# Patient Record
Sex: Female | Born: 1980
Health system: Southern US, Community
[De-identification: ages and names within clinical notes are randomized; demographics above are authoritative.]

## PROBLEM LIST (undated history)

## (undated) ENCOUNTER — Inpatient Hospital Stay (HOSPITAL_COMMUNITY): Payer: Self-pay

## (undated) ENCOUNTER — Inpatient Hospital Stay (HOSPITAL_COMMUNITY): Payer: 59

## (undated) ENCOUNTER — Emergency Department (HOSPITAL_COMMUNITY): Admission: EM | Payer: Self-pay | Source: Home / Self Care

## (undated) DIAGNOSIS — I10 Essential (primary) hypertension: Secondary | ICD-10-CM

## (undated) DIAGNOSIS — E039 Hypothyroidism, unspecified: Secondary | ICD-10-CM

## (undated) DIAGNOSIS — K219 Gastro-esophageal reflux disease without esophagitis: Secondary | ICD-10-CM

## (undated) DIAGNOSIS — K429 Umbilical hernia without obstruction or gangrene: Secondary | ICD-10-CM

## (undated) DIAGNOSIS — Z3483 Encounter for supervision of other normal pregnancy, third trimester: Secondary | ICD-10-CM

## (undated) DIAGNOSIS — Z9851 Tubal ligation status: Secondary | ICD-10-CM

## (undated) HISTORY — PX: NO PAST SURGERIES: SHX2092

## (undated) HISTORY — PX: WISDOM TOOTH EXTRACTION: SHX21

---
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
## 2000-12-05 ENCOUNTER — Emergency Department (HOSPITAL_COMMUNITY): Admission: EM | Admit: 2000-12-05 | Discharge: 2000-12-05 | Payer: Self-pay | Admitting: Emergency Medicine

## 2013-07-11 ENCOUNTER — Emergency Department (HOSPITAL_COMMUNITY): Payer: 59

## 2013-07-11 ENCOUNTER — Inpatient Hospital Stay (HOSPITAL_COMMUNITY)
Admission: EM | Admit: 2013-07-11 | Discharge: 2013-07-11 | Disposition: A | Discharge status: Other Acute Inpatient Hospital | Payer: 59 | Attending: Emergency Medicine | Admitting: Emergency Medicine

## 2013-07-11 ENCOUNTER — Encounter (HOSPITAL_COMMUNITY): Payer: Self-pay | Admitting: *Deleted

## 2013-07-11 DIAGNOSIS — O209 Hemorrhage in early pregnancy, unspecified: Secondary | ICD-10-CM

## 2013-07-11 DIAGNOSIS — E079 Disorder of thyroid, unspecified: Secondary | ICD-10-CM | POA: Insufficient documentation

## 2013-07-11 DIAGNOSIS — E039 Hypothyroidism, unspecified: Secondary | ICD-10-CM | POA: Insufficient documentation

## 2013-07-11 DIAGNOSIS — O039 Complete or unspecified spontaneous abortion without complication: Secondary | ICD-10-CM | POA: Insufficient documentation

## 2013-07-11 DIAGNOSIS — O034 Incomplete spontaneous abortion without complication: Secondary | ICD-10-CM

## 2013-07-11 HISTORY — DX: Hypothyroidism, unspecified: E03.9

## 2013-07-11 LAB — CBC
HCT: 37.3 % (ref 36.0–46.0)
Hemoglobin: 13 g/dL (ref 12.0–15.0)
MCH: 29.7 pg (ref 26.0–34.0)
MCHC: 34.9 g/dL (ref 30.0–36.0)
MCV: 85.2 fL (ref 78.0–100.0)
Platelets: 278 10*3/uL (ref 150–400)
RBC: 4.38 MIL/uL (ref 3.87–5.11)
RDW: 12.9 % (ref 11.5–15.5)
WBC: 8.6 10*3/uL (ref 4.0–10.5)

## 2013-07-11 LAB — ABO/RH: ABO/RH(D): A POS

## 2013-07-11 LAB — HCG, QUANTITATIVE, PREGNANCY: hCG, Beta Chain, Quant, S: 5632 m[IU]/mL — ABNORMAL HIGH (ref <5)

## 2013-07-11 MED ORDER — SODIUM CHLORIDE 0.9 % IV SOLN
INTRAVENOUS | Status: DC
  Administered 2013-07-11: 10:00:00 via INTRAVENOUS

## 2013-07-11 MED ORDER — OXYCODONE-ACETAMINOPHEN 5-325 MG PO TABS
2.0000 | ORAL_TABLET | Freq: Once | ORAL | Status: AC
  Administered 2013-07-11: 2 via ORAL
  Filled 2013-07-11: qty 2

## 2013-07-11 MED ORDER — HYDROMORPHONE HCL PF 1 MG/ML IJ SOLN
1.0000 mg | Freq: Once | INTRAMUSCULAR | Status: AC
  Administered 2013-07-11: 1 mg via INTRAVENOUS
  Filled 2013-07-11: qty 1

## 2013-07-11 MED ORDER — OXYCODONE-ACETAMINOPHEN 5-325 MG PO TABS: 1.0000 | ORAL_TABLET | ORAL | Status: DC | PRN

## 2013-07-11 MED ORDER — PROMETHAZINE HCL 25 MG PO TABS: 25.0000 mg | ORAL_TABLET | Freq: Four times a day (QID) | ORAL | Status: DC | PRN

## 2013-07-11 NOTE — ED Notes (Signed)
Pt reports she is 8 weeks 4 days bleeding with cramping that increases in intensity approx every 2 mins. sts blood had tissue in it as well. lmp July 12. Hx of blight ovum last year. No living children.

## 2013-07-11 NOTE — ED Provider Notes (Signed)
Patient complains of vaginal bleeding cramping and passing tissue since 4:30 AM today. She has used 3 pads since onset. Presently pain free. I spoke with Dr.Henley. [facility] Maternity admissions unit  for [facility] Results for orders placed during the hospital encounter of 07/11/13  CBC      Result Value Range   WBC 8.6  4.0 - 10.5 K/uL   RBC 4.38  3.87 - 5.11 MIL/uL   Hemoglobin 13.0  12.0 - 15.0 g/dL   HCT 16.1  09.6 - 04.5 %   MCV 85.2  78.0 - 100.0 fL   MCH 29.7  26.0 - 34.0 pg   MCHC 34.9  30.0 - 36.0 g/dL   RDW 40.9  81.1 - 91.4 %   Platelets 278  150 - 400 K/uL  HCG, QUANTITATIVE, PREGNANCY      Result Value Range   hCG, Beta Chain, Quant, S 5632 (*) <5 mIU/mL  ABO/RH      Result Value Range   ABO/RH(D) A POS     No rh immune globuloin NOT A RH IMMUNE GLOBULIN CANDIDATE, PT RH POSITIVE     US Ob Comp Less 14 Wks  07/11/2013   CLINICAL DATA:  Early pregnancy. Abnormal uterine bleeding.  EXAM: OBSTETRIC <14 WK [place] AND TRANSVAGINAL OB US  TECHNIQUE: Both transabdominal and transvaginal ultrasound examinations were performed for complete evaluation of the gestation as well as the maternal uterus, adnexal regions, and pelvic cul-de-sac. Transvaginal technique was performed to assess early pregnancy.  COMPARISON:  None.  FINDINGS: Intrauterine gestational sac: Single. The gestational sac is located low in the lower uterine segment near the cervix.  Yolk sac:  Yes  Embryo:  Yes  Cardiac Activity: No  CRL:   3.5  mm   6 w 0 d                  [place] EDC: 03/06/2014  Maternal uterus/adnexae: There is debris and clot in the uterine cavity extending from the fundus into the cervix and vagina. The cervix is open and filled with debris, measuring 6.5 mm in diameter. The right ovary is normal. Left ovary is not visualized. No free fluid.  IMPRESSION: Spontaneous abortion in progress. No fetal heart activity.   Electronically Signed   By: Geanie Cooley   On: 07/11/2013 08:09    US Ob Transvaginal  07/11/2013   CLINICAL DATA:  Early pregnancy. Abnormal uterine bleeding.  EXAM: OBSTETRIC <14 WK [place] AND TRANSVAGINAL OB US  TECHNIQUE: Both transabdominal and transvaginal ultrasound examinations were performed for complete evaluation of the gestation as well as the maternal uterus, adnexal regions, and pelvic cul-de-sac. Transvaginal technique was performed to assess early pregnancy.  COMPARISON:  None.  FINDINGS: Intrauterine gestational sac: Single. The gestational sac is located low in the lower uterine segment near the cervix.  Yolk sac:  Yes  Embryo:  Yes  Cardiac Activity: No  CRL:   3.5  mm   6 w 0 d                  [place] EDC: 03/06/2014  Maternal uterus/adnexae: There is debris and clot in the uterine cavity extending from the fundus into the cervix and vagina. The cervix is open and filled with debris, measuring 6.5 mm in diameter. The right ovary is normal. Left ovary is not visualized. No free fluid.  IMPRESSION: Spontaneous abortion in progress. No fetal heart activity.   Electronically Signed  By: Geanie Cooley   On: 07/11/2013 08:09   Dx inevitable abortion  Doug Sou, MD 07/11/13 [phone]

## 2013-07-11 NOTE — Progress Notes (Signed)
07/11/13 1300  Clinical Encounter Type  Visited With Patient and family together (husband Jill Murillo, parents)  Visit Type Other (Comment);Spiritual support;Social support (miscarriage--bereavement support)  Referral From Nurse  Spiritual Encounters  Spiritual Needs Grief support;Emotional  Stress Factors  Patient Stress Factors Loss;Loss of control;Major life changes (heartbreaking second miscarriage)  Family Stress Factors Loss;Loss of control;Major life changes (family wants to support Jill Murillo through grief into healing)   Thank you for timely referral.  Provided pastoral presence, reflective listening, bereavement support, and grief education to pt and family. Reviewed Comfort resources with pt and family. Spoke with family together, as well as with Jill Murillo's parents afterward, who are grieving for themselves and also want to support Jill Murillo as much as possible through her grieving and healing process.  Jill Murillo and her mom were most talkative about their hopes, pain, and self-care plans, and they both voiced appreciation for Spiritual Care and Comfort support.  Pt and family are aware of how to reach out if further support desired.  [address] Jill Murillo, [place] [phone]

## 2013-07-11 NOTE — MAU Note (Signed)
Pt brought by ambulance from [facility]. Pt has had heavy vaginal bleeding due to miscarriage. Pt is A&o x3. NAD noted at this time. States bleeding has subsided.

## 2013-07-11 NOTE — ED Notes (Signed)
Consulting civil engineer checked all EMTALA forms/content.   EMTALA and med records given to [facility] for transport.

## 2013-07-11 NOTE — ED Notes (Signed)
Per Dr. Ethelda Chick, wait on the IN/OUT for now.  Will advise MAU.

## 2013-07-11 NOTE — MAU Provider Note (Signed)
History     CSN: [id number]  Arrival date and time: 07/11/13 0416   First Provider Initiated Contact with Patient 07/11/13 1144      Chief Complaint  Patient presents with  . Vaginal Bleeding    [redacted] weeks pregnant   HPI  Ms. Jill Murillo is a 32 y.o. female G2P0010 at Unknown gestation who presents with vaginal bleeding. She was seen over at [facility] today and an US showed that her cervix is open and an SAB is in progress. She was sent here for further evaluation and to discuss her options. She received cytotec times 2 doses with her last miscarriage and ended up with a D & C due to retained products. She does not want to go through that again, and declines cytotec as an option. Currently the patient voices a significant amount of abdominal cramping; 5/10, she received 2 percocet this morning around 0630 which did not help her pain.   OB History   Grav Para Term Preterm Abortions TAB SAB Ect Mult Living                  Past Medical History  Diagnosis Date  . Hypothyroid     History reviewed. No pertinent past surgical history.  History reviewed. No pertinent family history.  History  Substance Use Topics  . Smoking status: Never Smoker   . Smokeless tobacco: Not on file  . Alcohol Use: No    Allergies: No Known Allergies  Prescriptions prior to admission  Medication Sig Dispense Refill  . acetaminophen (TYLENOL) 325 MG tablet Take 650 mg by mouth every 6 (six) hours as needed for pain.      Marland Kitchen levothyroxine (SYNTHROID, LEVOTHROID) 137 MCG tablet Take 137 mcg by mouth daily before breakfast.      . Prenatal Vit-Fe Fumarate-FA (PRENATAL MULTIVITAMIN) TABS tablet Take 1 tablet by mouth daily at 12 noon.       Results for orders placed during the hospital encounter of 07/11/13 (from the past 24 hour(s))  CBC     Status: None   Collection Time    07/11/13  5:15 AM      Result Value Range   WBC 8.6  4.0 - 10.5 K/uL   RBC 4.38  3.87 - 5.11 MIL/uL   Hemoglobin 13.0   12.0 - 15.0 g/dL   HCT 40.9  81.1 - 91.4 %   MCV 85.2  78.0 - 100.0 fL   MCH 29.7  26.0 - 34.0 pg   MCHC 34.9  30.0 - 36.0 g/dL   RDW 78.2  95.6 - 21.3 %   Platelets 278  150 - 400 K/uL  HCG, QUANTITATIVE, PREGNANCY     Status: Abnormal   Collection Time    07/11/13  5:15 AM      Result Value Range   hCG, Beta Chain, Quant, S 5632 (*) <5 mIU/mL  ABO/RH     Status: None   Collection Time    07/11/13  5:20 AM      Result Value Range   ABO/RH(D) A POS     No rh immune globuloin NOT A RH IMMUNE GLOBULIN CANDIDATE, PT RH POSITIVE      US Ob Comp Less 14 Wks  07/11/2013   CLINICAL DATA:  Early pregnancy. Abnormal uterine bleeding.  EXAM: OBSTETRIC <14 WK [place] AND TRANSVAGINAL OB US  TECHNIQUE: Both transabdominal and transvaginal ultrasound examinations were performed for complete evaluation of the gestation as well as the maternal  uterus, adnexal regions, and pelvic cul-de-sac. Transvaginal technique was performed to assess early pregnancy.  COMPARISON:  None.  FINDINGS: Intrauterine gestational sac: Single. The gestational sac is located low in the lower uterine segment near the cervix.  Yolk sac:  Yes  Embryo:  Yes  Cardiac Activity: No  CRL:   3.5  mm   6 w 0 d                  [place] EDC: 03/06/2014  Maternal uterus/adnexae: There is debris and clot in the uterine cavity extending from the fundus into the cervix and vagina. The cervix is open and filled with debris, measuring 6.5 mm in diameter. The right ovary is normal. Left ovary is not visualized. No free fluid.  IMPRESSION: Spontaneous abortion in progress. No fetal heart activity.   Electronically Signed   By: Geanie Cooley   On: 07/11/2013 08:09   US Ob Transvaginal  07/11/2013   CLINICAL DATA:  Early pregnancy. Abnormal uterine bleeding.  EXAM: OBSTETRIC <14 WK [place] AND TRANSVAGINAL OB US  TECHNIQUE: Both transabdominal and transvaginal ultrasound examinations were performed for complete evaluation of the gestation as well as the maternal  uterus, adnexal regions, and pelvic cul-de-sac. Transvaginal technique was performed to assess early pregnancy.  COMPARISON:  None.  FINDINGS: Intrauterine gestational sac: Single. The gestational sac is located low in the lower uterine segment near the cervix.  Yolk sac:  Yes  Embryo:  Yes  Cardiac Activity: No  CRL:   3.5  mm   6 w 0 d                  [place] EDC: 03/06/2014  Maternal uterus/adnexae: There is debris and clot in the uterine cavity extending from the fundus into the cervix and vagina. The cervix is open and filled with debris, measuring 6.5 mm in diameter. The right ovary is normal. Left ovary is not visualized. No free fluid.  IMPRESSION: Spontaneous abortion in progress. No fetal heart activity.   Electronically Signed   By: Geanie Cooley   On: 07/11/2013 08:09    Review of Systems  Constitutional: Negative for fever and chills.  Gastrointestinal: Positive for abdominal pain. Negative for nausea and vomiting.  Genitourinary: Negative for dysuria, urgency, frequency and hematuria.       No vaginal discharge. + vaginal bleeding. No dysuria.    Physical Exam   Blood pressure 124/70, pulse 71, temperature 98.2 F (36.8 C), temperature source Oral, resp. rate 18, height 5\' 3"  (1.6 m), weight 102.513 kg (226 lb), SpO2 99.00%.  Physical Exam  Constitutional: She appears well-developed and well-nourished. No distress.  HENT:  Head: Normocephalic.  Neck: Neck supple.  Respiratory: Effort normal.  GI: Soft. She exhibits no distension. There is no tenderness. There is no rebound and no guarding.  Genitourinary:  Speculum exam: Vagina - Moderate amount of bright red blood in vaginal canal; few small clots.  Cervix - small amount of active bleeding and mucous like discharge from cervical os Bimanual exam: Cervix 1 cm, thick, anterior  Uterus non tender, normal size for gestational age Adnexa non tender, no masses bilaterally Chaperone present for exam.   Skin: She is not  diaphoretic.    MAU Course  Procedures None  MDM 1 mg of dilaudid IVP . Pelvic exam Consulted with Dr. Ambrose Mantle regarding patient exam, plan of care discussed; ok to discharge patient home   Assessment and Plan  A: Vaginal bleeding in pregnancy SAB  in progress   P: Discharge home Bleeding precautions discussed Pt to follow up with Dr. Ebony Hail office in 1 week for follow up Beta Hcg  RX: Percocet 5/325 mg PO Q4-6 hours as needed for pain (#20) no rf        Phenergan 25 mg PO Q6 hours as needed for nausea (#10) no rf Return to MAU if symptoms were to worsen Pelvic rest discussed  Destaney Sarkis IRENE FNP-C 07/11/2013, 2:13 PM

## 2013-07-11 NOTE — ED Provider Notes (Signed)
CSN: [id number]     Arrival date & time 07/11/13  0416 History   First MD Initiated Contact with Patient 07/11/13 [phone]     Chief Complaint  Patient presents with  . Vaginal Bleeding    [redacted] weeks pregnant   (Consider location/radiation/quality/duration/timing/severity/associated sxs/prior Treatment) HPI 32 yo female presents to the ER with complaint of lower abdominal cramping, low back pain, and vaginal bleeding.  Pt reports she is G2P0010 at 8 weeks and 4 days.  She is followed by [facility].  She reports u/s at 6 weeks showed IUP, fetal pole.  She has prior blighted ovum last year, treated with oral meds for termination.  She reports she is passing clots and tissue.  Past Medical History  Diagnosis Date  . Hypothyroid    History reviewed. No pertinent past surgical history. History reviewed. No pertinent family history. History  Substance Use Topics  . Smoking status: Never Smoker   . Smokeless tobacco: Not on file  . Alcohol Use: No   OB History   Grav Para Term Preterm Abortions TAB SAB Ect Mult Living                 Review of Systems  All other systems reviewed and are negative.  other than listed in hpi  Allergies  Review of patient's allergies indicates no known allergies.  Home Medications   Current Outpatient Rx  Name  Route  Sig  Dispense  Refill  . acetaminophen (TYLENOL) 325 MG tablet   Oral   Take 650 mg by mouth every 6 (six) hours as needed for pain.         Marland Kitchen levothyroxine (SYNTHROID, LEVOTHROID) 137 MCG tablet   Oral   Take 137 mcg by mouth daily before breakfast.         . Prenatal Vit-Fe Fumarate-FA (PRENATAL MULTIVITAMIN) TABS tablet   Oral   Take 1 tablet by mouth daily at 12 noon.          BP 146/95  Pulse 80  Temp(Src) 98.3 F (36.8 C)  Resp 20  SpO2 98% Physical Exam  Nursing note and vitals reviewed. Constitutional: She is oriented to person, place, and time. She appears well-developed and well-nourished.  HENT:   Head: Normocephalic and atraumatic.  Nose: Nose normal.  Mouth/Throat: Oropharynx is clear and moist.  Eyes: Conjunctivae and EOM are normal. Pupils are equal, round, and reactive to light.  Neck: Normal range of motion. Neck supple. No JVD present. No tracheal deviation present. No thyromegaly present.  Cardiovascular: Normal rate, regular rhythm, normal heart sounds and intact distal pulses.  Exam reveals no gallop and no friction rub.   No murmur heard. Pulmonary/Chest: Effort normal and breath sounds normal. No stridor. No respiratory distress. She has no wheezes. She has no rales. She exhibits no tenderness.  Abdominal: Soft. Bowel sounds are normal. She exhibits no distension and no mass. There is tenderness (lower abdominal pain). There is no rebound and no guarding.  Genitourinary:  External genitalia normal Vagina with dark blood and clots in vault Cervix open, tissue and clot noted in os, removed with ring forceps No cervical motion tenderness Adnexa palpated, diffuse tenderness noted Bladder palpated no tenderness Uterus palpated diffuse tenderness    Musculoskeletal: Normal range of motion. She exhibits no edema and no tenderness.  Lymphadenopathy:    She has no cervical adenopathy.  Neurological: She is alert and oriented to person, place, and time. She exhibits normal muscle tone. Coordination normal.  Skin: Skin is warm and dry. No rash noted. No erythema. No pallor.  Psychiatric: She has a normal mood and affect. Her behavior is normal. Judgment and thought content normal.    ED Course  Procedures (including critical care time) Labs Review Labs Reviewed  HCG, QUANTITATIVE, PREGNANCY - Abnormal; Notable for the following:    hCG, Beta Chain, Quant, S 5632 (*)    All other components within normal limits  CBC  ABO/RH   Imaging Review No results found.  MDM   1. Vaginal bleeding in pregnancy, first trimester   2. Inevitable complete miscarriage without  complication     32 yo female with what appears to be inevitable miscarriage given open os, tissue in os/vagina.  D/w Dr Senaida Ores, on call for Dr Ellyn Hack, pt's OB.  She recommends getting u/s of uterus to determine amount of tissue remaining to be passed if this is a miscarriage, or if she still has a viable pregnancy.  Pt updated on plan.  Pt agreeable.  Will provide pain medication.  Dr Senaida Ores requested tissue retrieved from cervical os be sent down to path, but it has been disposed of already.      Olivia Mackie, MD 07/11/13 [phone]

## 2013-07-11 NOTE — ED Notes (Signed)
Pt states that she is [redacted] weeks pregnant and she started bleeding this morning around 02:00. Pt states abdominal cramping and back pain. Pt states blood is bright red.

## 2013-10-12 NOTE — L&D Delivery Note (Signed)
Delivery Note Pt progressed to complete and pushed well.  At 2252 had temp to 100.4, started on Unasyn.  Temp up to 101.6 prior to delivery.  At 12:13 AM a viable female was delivered via Vaginal, Spontaneous Delivery (Presentation: Left Occiput Anterior).  APGAR: 6, 8; weight 6 lb 15.8 oz (3170 g).   Placenta status: Intact, Spontaneous.  Cord: 3 vessels with the following complications: None.  Cord pH: 6.96.  Amniotic fluid was foul smelling.  Anesthesia: Epidural  Episiotomy: None Lacerations: 2nd degree Suture Repair: 3.0 vicryl rapide Est. Blood Loss (mL): 300  Mom to postpartum.  Baby to Couplet care / Skin to Skin.  Will discuss circumcision in the morning.  Tosha Belgarde D 06/05/2014, 12:46 AM

## 2013-10-16 ENCOUNTER — Encounter (HOSPITAL_BASED_OUTPATIENT_CLINIC_OR_DEPARTMENT_OTHER): Payer: Self-pay | Admitting: Emergency Medicine

## 2013-10-16 ENCOUNTER — Emergency Department (HOSPITAL_BASED_OUTPATIENT_CLINIC_OR_DEPARTMENT_OTHER)
Admission: EM | Admit: 2013-10-16 | Discharge: 2013-10-16 | Disposition: A | Discharge status: Home / Self Care | Payer: 59 | Attending: Emergency Medicine | Admitting: Emergency Medicine

## 2013-10-16 ENCOUNTER — Emergency Department (HOSPITAL_BASED_OUTPATIENT_CLINIC_OR_DEPARTMENT_OTHER): Payer: 59

## 2013-10-16 DIAGNOSIS — L0390 Cellulitis, unspecified: Secondary | ICD-10-CM

## 2013-10-16 DIAGNOSIS — Z79899 Other long term (current) drug therapy: Secondary | ICD-10-CM | POA: Insufficient documentation

## 2013-10-16 DIAGNOSIS — L03119 Cellulitis of unspecified part of limb: Secondary | ICD-10-CM

## 2013-10-16 DIAGNOSIS — L02619 Cutaneous abscess of unspecified foot: Secondary | ICD-10-CM | POA: Insufficient documentation

## 2013-10-16 DIAGNOSIS — O9989 Other specified diseases and conditions complicating pregnancy, childbirth and the puerperium: Secondary | ICD-10-CM | POA: Insufficient documentation

## 2013-10-16 DIAGNOSIS — E039 Hypothyroidism, unspecified: Secondary | ICD-10-CM | POA: Insufficient documentation

## 2013-10-16 MED ORDER — CEPHALEXIN 500 MG PO CAPS: 500.0000 mg | ORAL_CAPSULE | Freq: Four times a day (QID) | ORAL | Status: DC

## 2013-10-16 MED ORDER — HYDROCODONE-ACETAMINOPHEN 5-325 MG PO TABS: 1.0000 | ORAL_TABLET | ORAL | Status: DC | PRN

## 2013-10-16 NOTE — ED Provider Notes (Signed)
Medical screening examination/treatment/procedure(s) were performed by non-physician practitioner and as supervising physician I was immediately available for consultation/collaboration.  EKG Interpretation   None         Eulia Hatcher E Keisi Eckford, MD 10/16/13 2341 

## 2013-10-16 NOTE — ED Provider Notes (Signed)
CSN: [id number]     Arrival date & time 10/16/13  1853 History   First MD Initiated Contact with Patient 10/16/13 2026     Chief Complaint  Patient presents with  . Foot Pain   (Consider location/radiation/quality/duration/timing/severity/associated sxs/prior Treatment) HPI Comments: Pt states that there was swelling yesterday and then the redness has increased throughout the day today:denies history of gout  Patient is a 33 y.o. female presenting with lower extremity pain. The history is provided by the patient. No language interpreter was used.  Foot Pain This is a new problem. The current episode started yesterday. The problem occurs constantly. The problem has been gradually worsening. The symptoms are aggravated by walking. She has tried nothing for the symptoms.    Past Medical History  Diagnosis Date  . Hypothyroid    History reviewed. No pertinent past surgical history. No family history on file. History  Substance Use Topics  . Smoking status: Never Smoker   . Smokeless tobacco: Not on file  . Alcohol Use: No   OB History   Grav Para Term Preterm Abortions TAB SAB Ect Mult Living   1              Review of Systems  Constitutional: Negative.   Respiratory: Negative.   Cardiovascular: Negative.     Allergies  Review of patient's allergies indicates no known allergies.  Home Medications   Current Outpatient Rx  Name  Route  Sig  Dispense  Refill  . acetaminophen (TYLENOL) 325 MG tablet   Oral   Take 650 mg by mouth every 6 (six) hours as needed for pain.         . cephALEXin (KEFLEX) 500 MG capsule   Oral   Take 1 capsule (500 mg total) by mouth 4 (four) times daily.   28 capsule   0   . HYDROcodone-acetaminophen (NORCO/VICODIN) 5-325 MG per tablet   Oral   Take 1-2 tablets by mouth every 4 (four) hours as needed.   10 tablet   0   . levothyroxine (SYNTHROID, LEVOTHROID) 137 MCG tablet   Oral   Take 137 mcg by mouth daily before breakfast.          . oxyCODONE-acetaminophen (PERCOCET/ROXICET) 5-325 MG per tablet   Oral   Take 1-2 tablets by mouth every 4 (four) hours as needed for pain (Q4-6 hour PRN).   20 tablet   0   . Prenatal Vit-Fe Fumarate-FA (PRENATAL MULTIVITAMIN) TABS tablet   Oral   Take 1 tablet by mouth daily at 12 noon.         . promethazine (PHENERGAN) 25 MG tablet   Oral   Take 1 tablet (25 mg total) by mouth every 6 (six) hours as needed for nausea.   10 tablet   0    BP 144/97  Pulse 84  Temp(Src) 98.2 F (36.8 C) (Oral)  Resp 18  Ht 5\' 4"  (1.626 m)  Wt 223 lb (101.152 kg)  BMI 38.26 kg/m2  SpO2 100%  LMP 09/02/2013 Physical Exam  Nursing note and vitals reviewed. Constitutional: She is oriented to person, place, and time. She appears well-developed and well-nourished.  Cardiovascular: Normal rate and regular rhythm.   Pulmonary/Chest: Breath sounds normal.  Musculoskeletal: Normal range of motion.  Pedal pulses intact  Neurological: She is alert and oriented to person, place, and time. Coordination normal.  Skin:  Redness noted at the base of the left great toe with redness and going more proximal  up the foot    ED Course  Procedures (including critical care time) Labs Review Labs Reviewed - No data to display Imaging Review Dg Foot Complete Left  10/16/2013   CLINICAL DATA:  Left foot pain. Redness over the 1st metatarsal head.  EXAM: LEFT FOOT - COMPLETE 3+ VIEW  COMPARISON:  None.  FINDINGS: Negative for fracture or dislocation. Mild joint space narrowing and degenerative changes at the 1st MTP joint. There is no evidence for erosions or osteolysis. Alignment of the foot is normal.  IMPRESSION: No acute bone abnormality in the left foot.   Electronically Signed   By: Markus Daft M.D.   On: 10/16/2013 21:19    EKG Interpretation   None       MDM   1. Cellulitis    Discussed risk with use of vicodin in pregnancy:discussed possibility of gout with pt    Glendell Docker,  NP 10/16/13 2136

## 2013-10-16 NOTE — ED Notes (Signed)
Pain and swelling to her left foot. Redness at her great toe. No known injury.

## 2013-10-16 NOTE — Discharge Instructions (Signed)
Cellulitis °Cellulitis is an infection of the skin and the tissue under the skin. The infected area is usually red and tender. This happens most often in the arms and lower legs. °HOME CARE  °· Take your antibiotic medicine as told. Finish the medicine even if you start to feel better. °· Keep the infected arm or leg raised (elevated). °· Put a warm cloth on the area up to 4 times per day. °· Only take medicines as told by your doctor. °· Keep all doctor visits as told. °GET HELP RIGHT AWAY IF:  °· You have a fever. °· You feel very sleepy. °· You throw up (vomit) or have watery poop (diarrhea). °· You feel sick and have muscle aches and pains. °· You see red streaks on the skin coming from the infected area. °· Your red area gets bigger or turns a dark color. °· Your bone or joint under the infected area is painful after the skin heals. °· Your infection comes back in the same area or different area. °· You have a puffy (swollen) bump in the infected area. °· You have new symptoms. °MAKE SURE YOU:  °· Understand these instructions. °· Will watch your condition. °· Will get help right away if you are not doing well or get worse. °Document Released: 03/16/2008 Document Revised: 03/29/2012 Document Reviewed: 12/14/2011 °[employer]® Patient Information ©2014 [employer]. ° °

## 2013-11-02 LAB — OB RESULTS CONSOLE GC/CHLAMYDIA
CHLAMYDIA, DNA PROBE: NEGATIVE
GC PROBE AMP, GENITAL: NEGATIVE

## 2013-11-02 LAB — OB RESULTS CONSOLE HEPATITIS B SURFACE ANTIGEN: Hepatitis B Surface Ag: NEGATIVE

## 2013-11-02 LAB — OB RESULTS CONSOLE HIV ANTIBODY (ROUTINE TESTING): HIV: NONREACTIVE

## 2013-11-02 LAB — OB RESULTS CONSOLE RUBELLA ANTIBODY, IGM: RUBELLA: IMMUNE

## 2013-11-02 LAB — OB RESULTS CONSOLE RPR: RPR: NONREACTIVE

## 2013-12-08 ENCOUNTER — Encounter (HOSPITAL_COMMUNITY): Payer: Self-pay

## 2013-12-08 ENCOUNTER — Inpatient Hospital Stay (HOSPITAL_COMMUNITY)
Admission: AD | Admit: 2013-12-08 | Discharge: 2013-12-08 | Disposition: A | Discharge status: Home / Self Care | Payer: 59 | Source: Ambulatory Visit | Attending: Obstetrics and Gynecology | Admitting: Obstetrics and Gynecology

## 2013-12-08 DIAGNOSIS — O209 Hemorrhage in early pregnancy, unspecified: Secondary | ICD-10-CM | POA: Insufficient documentation

## 2013-12-08 DIAGNOSIS — Z3490 Encounter for supervision of normal pregnancy, unspecified, unspecified trimester: Secondary | ICD-10-CM

## 2013-12-08 NOTE — MAU Note (Signed)
Spotting started an hour ago.  Bright red when wiped, got lighter.  Didn't have to wear a pad. Denies pain.

## 2013-12-08 NOTE — MAU Provider Note (Signed)
  History     CSN: [id number]  Arrival date and time: 12/08/13 2143   None     Chief Complaint  Patient presents with  . Vaginal Bleeding   HPI Jill Murillo is 33 y.o. G3P0020 [redacted]w[redacted]d weeks presenting with vaginal spotting 1 hr ago--bright red at first and now lighter.  Denies pain.  Hx of 2 miscarriages--concerned.  Patient of Dr. Roe Rutherford.    Past Medical History  Diagnosis Date  . Hypothyroid     Past Surgical History  Procedure Laterality Date  . No past surgeries    . Wisdom tooth extraction      Family History  Problem Relation Age of Onset  . Hypertension Father     History  Substance Use Topics  . Smoking status: Never Smoker   . Smokeless tobacco: Never Used  . Alcohol Use: No    Allergies: No Known Allergies  Prescriptions prior to admission  Medication Sig Dispense Refill  . acetaminophen (TYLENOL) 325 MG tablet Take 650 mg by mouth every 6 (six) hours as needed for pain.      Marland Kitchen levothyroxine (SYNTHROID, LEVOTHROID) 137 MCG tablet Take 137 mcg by mouth daily before breakfast.      . Prenatal Vit-Fe Fumarate-FA (PRENATAL MULTIVITAMIN) TABS tablet Take 1 tablet by mouth daily at 12 noon.        Review of Systems  Gastrointestinal: Negative for abdominal pain.  Genitourinary:       + for vaginal bleeding, no clots   Physical Exam   Blood pressure 125/72, pulse 69, temperature 98.1 F (36.7 C), temperature source Oral, resp. rate 20, last menstrual period 09/02/2013.  Physical Exam  Constitutional: She is oriented to person, place, and time. She appears well-developed and well-nourished. No distress.  HENT:  Head: Normocephalic.  Neck: Normal range of motion.  Cardiovascular: Normal rate.   Respiratory: Effort normal.  GI: Soft. She exhibits no distension and no mass. There is no tenderness. There is no rebound and no guarding.  Genitourinary: There is no rash, tenderness or lesion on the right labia. There is no rash, tenderness or lesion on  the left labia. Uterus is enlarged (13 -14 week size ). Uterus is not tender. Bleeding: scant blood on swab.  Neg for active bleeding and clot. No vaginal discharge found.  Cervix is closed  Neurological: She is alert and oriented to person, place, and time.  Skin: Skin is warm and dry.  Psychiatric: She has a normal mood and affect. Her behavior is normal.   FHR by doppler 140  MAU Course  Procedures  MDM Reported MSE to Dr. Marvel Plan at 10:45.  Reported physical findings and + FHTs by doppler Order for patient to have pelvic rest and follow up with Dr. Melba Coon  Assessment and Plan  A:  Vaginal bleeding [redacted]w[redacted]d gestation      Viable pregnancy  P:  Reassured       Pelvic rest until re-evaluated        Follow up next week in the office      Call if sxs worsen         Damontay Alred,EVE M 12/08/2013, 10:23 PM

## 2013-12-08 NOTE — Discharge Instructions (Signed)
Pelvic Rest °Pelvic rest is sometimes recommended for women when:  °· The placenta is partially or completely covering the opening of the cervix (placenta previa). °· There is bleeding between the uterine wall and the amniotic sac in the first trimester (subchorionic hemorrhage). °· The cervix begins to open without labor starting (incompetent cervix, cervical insufficiency). °· The labor is too early (preterm labor). °HOME CARE INSTRUCTIONS °· Do not have sexual intercourse, stimulation, or an orgasm. °· Do not use tampons, douche, or put anything in the vagina. °· Do not lift anything over 10 pounds (4.5 kg). °· Avoid strenuous activity or straining your pelvic muscles. °SEEK MEDICAL CARE IF:  °· You have any vaginal bleeding during pregnancy. Treat this as a potential emergency. °· You have cramping pain felt low in the stomach (stronger than menstrual cramps). °· You notice vaginal discharge (watery, mucus, or bloody). °· You have a low, dull backache. °· There are regular contractions or uterine tightening. °SEEK IMMEDIATE MEDICAL CARE IF: °You have vaginal bleeding and have placenta previa.  °Document Released: 01/23/2011 Document Revised: 12/21/2011 Document Reviewed: 01/23/2011 °Exit[employer]® Patient Information ©2014 [employer]. ° °Vaginal Bleeding During Pregnancy, Second Trimester °A small amount of bleeding (spotting) from the vagina is relatively common in pregnancy. It usually stops on its own. Various things can cause bleeding or spotting in pregnancy. Some bleeding may be related to the pregnancy, and some may not. Sometimes the bleeding is normal and is not a problem. However, bleeding can also be a sign of something serious. Be sure to tell your health care provider about any vaginal bleeding right away. °Some possible causes of vaginal bleeding during the second trimester include: °· Infection, inflammation, or growths on the cervix.   °· The placenta may be partially or completely covering the  opening of the cervix inside the uterus (placenta previa). °· The placenta may have separated from the uterus (abruption of the placenta).   °· You may be having early (preterm) labor.   °· The cervix may not be strong enough to keep a baby inside the uterus (cervical insufficiency).   °· Tiny cysts may have developed in the uterus instead of pregnancy tissue (molar pregnancy).  °HOME CARE INSTRUCTIONS  °Watch your condition for any changes. The following actions may help to lessen any discomfort you are feeling: °· Follow your health care provider's instructions for limiting your activity. If your health care provider orders bed rest, you may need to stay in bed and only get up to use the bathroom. However, your health care provider may allow you to continue light activity. °· If needed, make plans for someone to help with your regular activities and responsibilities while you are on bed rest. °· Keep track of the number of pads you use each day, how often you change pads, and how soaked (saturated) they are. Write this down. °· Do not use tampons. Do not douche. °· Do not have sexual intercourse or orgasms until approved by your health care provider. °· If you pass any tissue from your vagina, save the tissue so you can show it to your health care provider. °· Only take over-the-counter or prescription medicines as directed by your health care provider. °· Do not take aspirin because it can make you bleed. °· Do not exercise or perform any strenuous activities or heavy lifting without your health care provider's permission. °· Keep all follow-up appointments as directed by your health care provider. °SEEK MEDICAL CARE IF: °· You have any vaginal bleeding during any part   your pregnancy.  You have cramps or labor pains. SEEK IMMEDIATE MEDICAL CARE IF:   You have severe cramps in your back or belly (abdomen).  You have contractions.  You have a fever, not controlled by medicine.  You have chills.  You  pass large clots or tissue from your vagina.  Your bleeding increases.  You feel lightheaded or weak, or you have fainting episodes.  You are leaking fluid or have a gush of fluid from your vagina. MAKE SURE YOU:  Understand these instructions.  Will watch your condition.  Will get help right away if you are not doing well or get worse. Document Released: 07/08/2005 Document Revised: 07/19/2013 Document Reviewed: 06/05/2013 [facility] Patient Information 2014 [place].

## 2014-05-18 LAB — OB RESULTS CONSOLE GBS: STREP GROUP B AG: NEGATIVE

## 2014-06-04 ENCOUNTER — Inpatient Hospital Stay (HOSPITAL_COMMUNITY): Payer: 59 | Admitting: Anesthesiology

## 2014-06-04 ENCOUNTER — Encounter (HOSPITAL_COMMUNITY): Payer: Self-pay | Admitting: *Deleted

## 2014-06-04 ENCOUNTER — Encounter (HOSPITAL_COMMUNITY): Payer: 59 | Admitting: Anesthesiology

## 2014-06-04 ENCOUNTER — Inpatient Hospital Stay (HOSPITAL_COMMUNITY)
Admission: AD | Admit: 2014-06-04 | Discharge: 2014-06-07 | DRG: 775 | Disposition: A | Discharge status: Home / Self Care | Payer: 59 | Source: Ambulatory Visit | Attending: Obstetrics and Gynecology | Admitting: Obstetrics and Gynecology

## 2014-06-04 DIAGNOSIS — O411090 Infection of amniotic sac and membranes, unspecified, unspecified trimester, not applicable or unspecified: Secondary | ICD-10-CM | POA: Diagnosis present

## 2014-06-04 DIAGNOSIS — Z8249 Family history of ischemic heart disease and other diseases of the circulatory system: Secondary | ICD-10-CM

## 2014-06-04 DIAGNOSIS — O479 False labor, unspecified: Secondary | ICD-10-CM | POA: Diagnosis present

## 2014-06-04 DIAGNOSIS — E079 Disorder of thyroid, unspecified: Secondary | ICD-10-CM | POA: Diagnosis present

## 2014-06-04 DIAGNOSIS — E039 Hypothyroidism, unspecified: Secondary | ICD-10-CM | POA: Diagnosis present

## 2014-06-04 DIAGNOSIS — O99284 Endocrine, nutritional and metabolic diseases complicating childbirth: Secondary | ICD-10-CM

## 2014-06-04 DIAGNOSIS — Z3490 Encounter for supervision of normal pregnancy, unspecified, unspecified trimester: Secondary | ICD-10-CM

## 2014-06-04 DIAGNOSIS — O411290 Chorioamnionitis, unspecified trimester, not applicable or unspecified: Secondary | ICD-10-CM | POA: Diagnosis not present

## 2014-06-04 LAB — COMPREHENSIVE METABOLIC PANEL
ALT: 10 U/L (ref 0–35)
AST: 13 U/L (ref 0–37)
Albumin: 2.9 g/dL — ABNORMAL LOW (ref 3.5–5.2)
Alkaline Phosphatase: 129 U/L — ABNORMAL HIGH (ref 39–117)
Anion gap: 13 (ref 5–15)
BUN: 9 mg/dL (ref 6–23)
CALCIUM: 8.9 mg/dL (ref 8.4–10.5)
CO2: 20 meq/L (ref 19–32)
CREATININE: 0.57 mg/dL (ref 0.50–1.10)
Chloride: 103 mEq/L (ref 96–112)
GFR calc Af Amer: >90 mL/min (ref >90)
Glucose, Bld: 72 mg/dL (ref 70–99)
Potassium: 4.1 mEq/L (ref 3.7–5.3)
Sodium: 136 mEq/L — ABNORMAL LOW (ref 137–147)
Total Bilirubin: 0.3 mg/dL (ref 0.3–1.2)
Total Protein: 6.2 g/dL (ref 6.0–8.3)

## 2014-06-04 LAB — URINALYSIS, ROUTINE W REFLEX MICROSCOPIC
Bilirubin Urine: NEGATIVE
Glucose, UA: NEGATIVE mg/dL
Ketones, ur: NEGATIVE mg/dL
Nitrite: NEGATIVE
PROTEIN: NEGATIVE mg/dL
Specific Gravity, Urine: 1.015 (ref 1.005–1.030)
UROBILINOGEN UA: 0.2 mg/dL (ref 0.0–1.0)
pH: 6 (ref 5.0–8.0)

## 2014-06-04 LAB — CBC
HEMATOCRIT: 38.6 % (ref 36.0–46.0)
HEMOGLOBIN: 13.3 g/dL (ref 12.0–15.0)
MCH: 29.9 pg (ref 26.0–34.0)
MCHC: 34.5 g/dL (ref 30.0–36.0)
MCV: 86.7 fL (ref 78.0–100.0)
Platelets: 188 10*3/uL (ref 150–400)
RBC: 4.45 MIL/uL (ref 3.87–5.11)
RDW: 14.5 % (ref 11.5–15.5)
WBC: 14.3 10*3/uL — ABNORMAL HIGH (ref 4.0–10.5)

## 2014-06-04 LAB — PROTEIN / CREATININE RATIO, URINE
CREATININE, URINE: 103.53 mg/dL
Protein Creatinine Ratio: 0.09 (ref 0.00–0.15)
Total Protein, Urine: 9.5 mg/dL

## 2014-06-04 LAB — TYPE AND SCREEN
ABO/RH(D): A POS
Antibody Screen: NEGATIVE

## 2014-06-04 LAB — URINE MICROSCOPIC-ADD ON

## 2014-06-04 MED ORDER — OXYCODONE-ACETAMINOPHEN 5-325 MG PO TABS: 1.0000 | ORAL_TABLET | ORAL | Status: DC | PRN

## 2014-06-04 MED ORDER — EPHEDRINE 5 MG/ML INJ
10.0000 mg | INTRAVENOUS | Status: DC | PRN
  Filled 2014-06-04: qty 4
  Filled 2014-06-04: qty 2

## 2014-06-04 MED ORDER — OXYTOCIN 40 UNITS IN LACTATED RINGERS INFUSION - SIMPLE MED
62.5000 mL/h | INTRAVENOUS | Status: DC
  Administered 2014-06-05: 62.5 mL/h via INTRAVENOUS
  Filled 2014-06-04: qty 1000

## 2014-06-04 MED ORDER — OXYTOCIN BOLUS FROM INFUSION
500.0000 mL | INTRAVENOUS | Status: DC
  Administered 2014-06-05: 500 mL via INTRAVENOUS

## 2014-06-04 MED ORDER — EPHEDRINE 5 MG/ML INJ
10.0000 mg | INTRAVENOUS | Status: DC | PRN
  Filled 2014-06-04: qty 2

## 2014-06-04 MED ORDER — LACTATED RINGERS IV SOLN: 500.0000 mL | Freq: Once | INTRAVENOUS | Status: DC

## 2014-06-04 MED ORDER — ONDANSETRON HCL 4 MG/2ML IJ SOLN: 4.0000 mg | Freq: Four times a day (QID) | INTRAMUSCULAR | Status: DC | PRN

## 2014-06-04 MED ORDER — LIDOCAINE HCL (PF) 1 % IJ SOLN
30.0000 mL | INTRAMUSCULAR | Status: DC | PRN
  Filled 2014-06-04: qty 30

## 2014-06-04 MED ORDER — ACETAMINOPHEN 325 MG PO TABS
650.0000 mg | ORAL_TABLET | ORAL | Status: DC | PRN
  Administered 2014-06-04: 650 mg via ORAL
  Filled 2014-06-04: qty 2

## 2014-06-04 MED ORDER — CITRIC ACID-SODIUM CITRATE 334-500 MG/5ML PO SOLN: 30.0000 mL | ORAL | Status: DC | PRN

## 2014-06-04 MED ORDER — IBUPROFEN 600 MG PO TABS
600.0000 mg | ORAL_TABLET | Freq: Four times a day (QID) | ORAL | Status: DC | PRN
  Administered 2014-06-05: 600 mg via ORAL
  Filled 2014-06-04: qty 1

## 2014-06-04 MED ORDER — LACTATED RINGERS IV SOLN
INTRAVENOUS | Status: DC
  Administered 2014-06-04 (×2): via INTRAVENOUS

## 2014-06-04 MED ORDER — PHENYLEPHRINE 40 MCG/ML (10ML) SYRINGE FOR IV PUSH (FOR BLOOD PRESSURE SUPPORT)
80.0000 ug | PREFILLED_SYRINGE | INTRAVENOUS | Status: DC | PRN
  Filled 2014-06-04: qty 2

## 2014-06-04 MED ORDER — LACTATED RINGERS IV SOLN
500.0000 mL | INTRAVENOUS | Status: DC | PRN
  Administered 2014-06-04: 1000 mL via INTRAVENOUS

## 2014-06-04 MED ORDER — SODIUM CHLORIDE 0.9 % IV SOLN
3.0000 g | Freq: Once | INTRAVENOUS | Status: AC
  Administered 2014-06-04: 3 g via INTRAVENOUS
  Filled 2014-06-04: qty 3

## 2014-06-04 MED ORDER — PHENYLEPHRINE 40 MCG/ML (10ML) SYRINGE FOR IV PUSH (FOR BLOOD PRESSURE SUPPORT)
80.0000 ug | PREFILLED_SYRINGE | INTRAVENOUS | Status: DC | PRN
  Filled 2014-06-04: qty 2
  Filled 2014-06-04: qty 10

## 2014-06-04 MED ORDER — BUTORPHANOL TARTRATE 1 MG/ML IJ SOLN: 1.0000 mg | INTRAMUSCULAR | Status: DC | PRN

## 2014-06-04 MED ORDER — FENTANYL 2.5 MCG/ML BUPIVACAINE 1/10 % EPIDURAL INFUSION (WH - ANES)
14.0000 mL/h | INTRAMUSCULAR | Status: DC | PRN
  Filled 2014-06-04: qty 125

## 2014-06-04 MED ORDER — LIDOCAINE HCL (PF) 1 % IJ SOLN
INTRAMUSCULAR | Status: DC | PRN
  Administered 2014-06-04: 10 mL

## 2014-06-04 MED ORDER — FENTANYL 2.5 MCG/ML BUPIVACAINE 1/10 % EPIDURAL INFUSION (WH - ANES)
14.0000 mL/h | INTRAMUSCULAR | Status: DC | PRN
  Administered 2014-06-04: 14 mL/h via EPIDURAL

## 2014-06-04 MED ORDER — DIPHENHYDRAMINE HCL 50 MG/ML IJ SOLN: 12.5000 mg | INTRAMUSCULAR | Status: DC | PRN

## 2014-06-04 NOTE — Anesthesia Procedure Notes (Signed)
Epidural Patient location during procedure: OB  Preanesthetic Checklist Completed: patient identified, site marked, surgical consent, pre-op evaluation, timeout performed, IV checked, risks and benefits discussed and monitors and equipment checked  Epidural Patient position: sitting Prep: site prepped and draped and DuraPrep Patient monitoring: continuous pulse ox and blood pressure Approach: midline Injection technique: LOR air  Needle:  Needle type: Tuohy  Needle gauge: 17 G Needle length: 9 cm and 9 Needle insertion depth: 7 cm Catheter type: closed end flexible Catheter size: 19 Gauge Catheter at skin depth: 14 cm Test dose: negative  Assessment Events: blood not aspirated, injection not painful, no injection resistance, negative IV test and no paresthesia  Additional Notes Dosing of Epidural:  1st dose, through catheter ............................................Marland Kitchen  Xylocaine 40 mg  2nd dose, through catheter, after waiting 3 minutes........Marland KitchenXylocaine 60 mg    ( 1% Xylo charted as a single dose in Epic Meds for ease of charting; actual dosing was fractionated as above, for saftey's sake)  As each dose occurred, patient was free of IV sx; and patient exhibited no evidence of SA injection.  Patient is more comfortable after epidural dosed. Please see RN's note for documentation of vital signs,and FHR which are stable.  Patient reminded not to try to ambulate with numb legs, and that an RN must be present when she attempts to get up.

## 2014-06-04 NOTE — Anesthesia Preprocedure Evaluation (Signed)

## 2014-06-04 NOTE — Progress Notes (Signed)
Feeling ctx VE-4/70/-2, vtx, AROM clear, FSE applied Will get epidural and monitor progress

## 2014-06-04 NOTE — H&P (Signed)
Jill Murillo is a 33 y.o. female, G3 P0020, EGA 39+ weeks with EDC 8-29 presenting for eval of ctx.  In MAU over several hours, cervix changed from 2-3 cm to 4 cm.  BP slightly elevated, nl PIH labs, no proteinuria.  Prenatal care complicated by hypothyroidism, see prenatal records for complete history.  Maternal Medical History:  Reason for admission: Contractions.   Contractions: Frequency: regular.   Perceived severity is moderate.    Fetal activity: Perceived fetal activity is normal.    Prenatal complications: no prenatal complications   OB History   Grav Para Term Preterm Abortions TAB SAB Ect Mult Living   3    2  2    0     Past Medical History  Diagnosis Date  . Hypothyroid    Past Surgical History  Procedure Laterality Date  . No past surgeries    . Wisdom tooth extraction     Family History: family history includes Hypertension in her father. Social History:  reports that she has never smoked. She has never used smokeless tobacco. She reports that she does not drink alcohol or use illicit drugs.   Prenatal Transfer Tool  Maternal Diabetes: No Genetic Screening: Normal Maternal Ultrasounds/Referrals: Normal Fetal Ultrasounds or other Referrals:  None Maternal Substance Abuse:  No Significant Maternal Medications:  Meds include: Syntroid Significant Maternal Lab Results:  Lab values include: Group B Strep negative Other Comments:  None  Review of Systems  Respiratory: Negative.   Cardiovascular: Negative.     Dilation: 4 Effacement (%): 60 Station: -2;-1 Exam by:: Dr. Willis Modena Blood pressure 153/85, pulse 96, temperature 98.3 F (36.8 C), temperature source Oral, resp. rate 20, height 5\' 4"  (1.626 m), weight 112.038 kg (247 lb), last menstrual period 09/02/2013. Maternal Exam:  Uterine Assessment: Contraction strength is moderate.  Contraction frequency is regular.   Abdomen: Patient reports no abdominal tenderness. Estimated fetal weight is 7 1/2  lbs.   Fetal presentation: vertex  Introitus: Normal vulva. Normal vagina.  Amniotic fluid character: not assessed.  Pelvis: adequate for delivery.   Cervix: Cervix evaluated by digital exam.     Fetal Exam Fetal Monitor Review: Mode: ultrasound.   Variability: moderate (6-25 bpm).   Pattern: accelerations present and no decelerations.    Fetal State Assessment: Category I - tracings are normal.     Physical Exam  Vitals reviewed. Constitutional: She appears well-developed and well-nourished.  Cardiovascular: Normal rate, regular rhythm and normal heart sounds.   No murmur heard. Respiratory: Effort normal and breath sounds normal. No respiratory distress. She has no wheezes.  GI: Soft.    Prenatal labs: ABO, Rh: --/--/A POS (09/30 0520) Antibody:  neg Rubella:  Immune RPR:   NR HBsAg:   Neg HIV:   NR GBS: Negative (08/07 0000)  GCT:  Nl x 2  Assessment/Plan: IUP at 39+ weeks in early labor with slightly elevated BP, normal labs.  Will admit, AROM and monitor progress and BP.   Bolivar Koranda D 06/04/2014, 5:55 PM

## 2014-06-04 NOTE — Progress Notes (Signed)
Comfortable with epidural Afeb, VSS FHT- Cat II, min-mod variability, variable decels, ctx not tracing well VE-9/C/+1, vtx, IUPC placed Monitor progress and FHT, anticipate SVD, may need amnioinfusion

## 2014-06-04 NOTE — MAU Note (Signed)
UC's this AM, seem to be getting stronger. No leaking or bleeding.

## 2014-06-05 ENCOUNTER — Encounter (HOSPITAL_COMMUNITY): Payer: Self-pay | Admitting: *Deleted

## 2014-06-05 DIAGNOSIS — O411290 Chorioamnionitis, unspecified trimester, not applicable or unspecified: Secondary | ICD-10-CM | POA: Diagnosis not present

## 2014-06-05 LAB — CBC
HCT: 35.4 % — ABNORMAL LOW (ref 36.0–46.0)
HEMATOCRIT: 35.8 % — AB (ref 36.0–46.0)
Hemoglobin: 12.2 g/dL (ref 12.0–15.0)
Hemoglobin: 12.1 g/dL (ref 12.0–15.0)
MCH: 29.3 pg (ref 26.0–34.0)
MCH: 29.7 pg (ref 26.0–34.0)
MCHC: 34.1 g/dL (ref 30.0–36.0)
MCHC: 34.2 g/dL (ref 30.0–36.0)
MCV: 86.1 fL (ref 78.0–100.0)
MCV: 86.8 fL (ref 78.0–100.0)
PLATELETS: 196 10*3/uL (ref 150–400)
Platelets: 171 10*3/uL (ref 150–400)
RBC: 4.16 MIL/uL (ref 3.87–5.11)
RBC: 4.08 MIL/uL (ref 3.87–5.11)
RDW: 14.3 % (ref 11.5–15.5)
RDW: 14.4 % (ref 11.5–15.5)
WBC: 18.5 10*3/uL — ABNORMAL HIGH (ref 4.0–10.5)
WBC: 23.4 10*3/uL — ABNORMAL HIGH (ref 4.0–10.5)

## 2014-06-05 LAB — ABO/RH: ABO/RH(D): A POS

## 2014-06-05 LAB — RPR

## 2014-06-05 MED ORDER — METHYLERGONOVINE MALEATE 0.2 MG PO TABS: 0.2000 mg | ORAL_TABLET | ORAL | Status: DC | PRN

## 2014-06-05 MED ORDER — SODIUM CHLORIDE 0.9 % IV SOLN
Freq: Four times a day (QID) | INTRAVENOUS | Status: DC | PRN
  Administered 2014-06-05: 05:00:00 via INTRAVENOUS

## 2014-06-05 MED ORDER — WITCH HAZEL-GLYCERIN EX PADS: 1.0000 "application " | MEDICATED_PAD | CUTANEOUS | Status: DC | PRN

## 2014-06-05 MED ORDER — SODIUM CHLORIDE 0.9 % IJ SOLN: 3.0000 mL | INTRAMUSCULAR | Status: DC | PRN

## 2014-06-05 MED ORDER — SODIUM CHLORIDE 0.9 % IV SOLN
3.0000 g | Freq: Four times a day (QID) | INTRAVENOUS | Status: DC
  Administered 2014-06-05 – 2014-06-06 (×5): 3 g via INTRAVENOUS
  Filled 2014-06-05 (×7): qty 3

## 2014-06-05 MED ORDER — LEVOTHYROXINE SODIUM 137 MCG PO TABS
137.0000 ug | ORAL_TABLET | Freq: Every day | ORAL | Status: DC
  Administered 2014-06-05 – 2014-06-07 (×3): 137 ug via ORAL
  Filled 2014-06-05 (×4): qty 1

## 2014-06-05 MED ORDER — BENZOCAINE-MENTHOL 20-0.5 % EX AERO
1.0000 "application " | INHALATION_SPRAY | CUTANEOUS | Status: DC | PRN
  Administered 2014-06-05: 1 via TOPICAL
  Filled 2014-06-05: qty 56

## 2014-06-05 MED ORDER — TETANUS-DIPHTH-ACELL PERTUSSIS 5-2.5-18.5 LF-MCG/0.5 IM SUSP: 0.5000 mL | Freq: Once | INTRAMUSCULAR | Status: DC

## 2014-06-05 MED ORDER — SENNOSIDES-DOCUSATE SODIUM 8.6-50 MG PO TABS
2.0000 | ORAL_TABLET | ORAL | Status: DC
  Administered 2014-06-06 (×2): 2 via ORAL
  Filled 2014-06-05 (×2): qty 2

## 2014-06-05 MED ORDER — METHYLERGONOVINE MALEATE 0.2 MG/ML IJ SOLN: 0.2000 mg | INTRAMUSCULAR | Status: DC | PRN

## 2014-06-05 MED ORDER — MEASLES, MUMPS & RUBELLA VAC ~~LOC~~ INJ
0.5000 mL | INJECTION | Freq: Once | SUBCUTANEOUS | Status: DC
  Filled 2014-06-05: qty 0.5

## 2014-06-05 MED ORDER — ONDANSETRON HCL 4 MG/2ML IJ SOLN: 4.0000 mg | INTRAMUSCULAR | Status: DC | PRN

## 2014-06-05 MED ORDER — ONDANSETRON HCL 4 MG PO TABS: 4.0000 mg | ORAL_TABLET | ORAL | Status: DC | PRN

## 2014-06-05 MED ORDER — PRENATAL MULTIVITAMIN CH
1.0000 | ORAL_TABLET | Freq: Every day | ORAL | Status: DC
  Administered 2014-06-05 – 2014-06-07 (×3): 1 via ORAL
  Filled 2014-06-05 (×3): qty 1

## 2014-06-05 MED ORDER — ZOLPIDEM TARTRATE 5 MG PO TABS: 5.0000 mg | ORAL_TABLET | Freq: Every evening | ORAL | Status: DC | PRN

## 2014-06-05 MED ORDER — SIMETHICONE 80 MG PO CHEW: 80.0000 mg | CHEWABLE_TABLET | ORAL | Status: DC | PRN

## 2014-06-05 MED ORDER — LANOLIN HYDROUS EX OINT: TOPICAL_OINTMENT | CUTANEOUS | Status: DC | PRN

## 2014-06-05 MED ORDER — MAGNESIUM HYDROXIDE 400 MG/5ML PO SUSP: 30.0000 mL | ORAL | Status: DC | PRN

## 2014-06-05 MED ORDER — OXYCODONE-ACETAMINOPHEN 5-325 MG PO TABS
1.0000 | ORAL_TABLET | ORAL | Status: DC | PRN
  Administered 2014-06-05 – 2014-06-07 (×4): 1 via ORAL
  Filled 2014-06-05 (×4): qty 1

## 2014-06-05 MED ORDER — DIBUCAINE 1 % RE OINT: 1.0000 "application " | TOPICAL_OINTMENT | RECTAL | Status: DC | PRN

## 2014-06-05 MED ORDER — DIPHENHYDRAMINE HCL 25 MG PO CAPS: 25.0000 mg | ORAL_CAPSULE | Freq: Four times a day (QID) | ORAL | Status: DC | PRN

## 2014-06-05 MED ORDER — IBUPROFEN 600 MG PO TABS
600.0000 mg | ORAL_TABLET | Freq: Four times a day (QID) | ORAL | Status: DC
  Administered 2014-06-05 – 2014-06-07 (×9): 600 mg via ORAL
  Filled 2014-06-05 (×9): qty 1

## 2014-06-05 NOTE — Progress Notes (Signed)
Additional RN called in for delivery

## 2014-06-05 NOTE — Progress Notes (Signed)
PPD #0 Doing well Afeb since delivery, VSS, BP normal Fundus firm Continue routine care, will continue Unasyn today for chorio

## 2014-06-05 NOTE — Anesthesia Postprocedure Evaluation (Signed)
  Anesthesia Post-op Note  Anesthesia Post Note  Patient: Jill Murillo  Procedure(s) Performed: * No procedures listed *  Anesthesia type: Epidural  Patient location: Mother/Baby  Post pain: Pain level controlled  Post assessment: Post-op Vital signs reviewed  Last Vitals:  Filed Vitals:   06/05/14 0345  BP: 129/71  Pulse: 89  Temp: 36.9 C  Resp: 18    Post vital signs: Reviewed  Level of consciousness:alert  Complications: No apparent anesthesia complications

## 2014-06-06 NOTE — Lactation Note (Signed)
This note was copied from the chart of [place]. Lactation Consultation Note Follow up visit at 68 hours of age.  Mom attempted pump and now baby is fussy.  Assisted with positioning and nipple shield.  Baby very fussy when placed close to breast.  Tried laying back position, with out nipple shield in cross cradle and baby screamed.  Baby didn't have a coordinated suck on finger and continued to cry.  Offered bottle with 5 mls of formula, baby quickly calmed.  Continued attempt at the breast.  Baby latched with #20 nipple shield preloaded with formula, then syringe fed with formula for a total of 68mls.  Baby stayed vigorous at the breast for >20 minutes, and continued for 30 minutes total.  Baby then asleep at moms chest.  #20 nipple shield is better fit for right nipple and mom reports less pain. Mom is encouraged by this feeding.   Mom to call for assist as needed.    Patient Name: Jill Murillo [id number] Date: 06/06/2014 Reason for consult: Follow-up assessment;Difficult latch   Maternal Data Has patient been taught Hand Expression?: Yes Does the patient have breastfeeding experience prior to this delivery?: No  Feeding Feeding Type: Breast Fed  LATCH Score/Interventions Latch: Repeated attempts needed to sustain latch, nipple held in mouth throughout feeding, stimulation needed to elicit sucking reflex. Intervention(s): Skin to skin;Teach feeding cues;Waking techniques  Audible Swallowing: A few with stimulation Intervention(s): Hand expression;Skin to skin Intervention(s): Skin to skin;Hand expression;Alternate breast massage  Type of Nipple: Flat Intervention(s): Double electric pump  Comfort (Breast/Nipple): Soft / non-tender     Hold (Positioning): Assistance needed to correctly position infant at breast and maintain latch. Intervention(s): Skin to skin;Position options;Support Pillows;Breastfeeding basics reviewed  LATCH Score: 6  Lactation Tools  Discussed/Used Tools: Pump;Nipple [place] Fuel;Bottle Nipple shield size: 20;24 Breast pump type: Double-Electric Breast Pump   Consult Status Consult Status: Follow-up Date: 06/07/14 Follow-up type: In-patient    Justice Britain 06/06/2014, 10:44 PM

## 2014-06-06 NOTE — Lactation Note (Signed)
This note was copied from the chart of [place]. Lactation Consultation Note Follow up visit at 44 hours.  Mom reports just finishing pumping with DEBP and hand expression, but didn't collect any colostrum.  Mom is feeling discouraged and already gave baby a bottle of formula.  Mom is tearful and happy that she knows baby has eaten well.  Encouraged mom to continue to attempt breastfeeding with nipple shield and call for assist as needed.  Mom will pump every 3 hours or after feedings with hand expression and give back to baby if she collects any EBM.  Mom is complaining of nipple pain with nipple shield and reports baby is very fussy going to the breast.  Discussed early feeding cues.  Encouraged mom to continue to get [facility] scores with breast attempts.  Baby has only had one void in >24 hours therefore supplement is encouraged, until breastfeeding is improved.  Mom to call for support as needed.  Report given to [facility] RN.    Patient Name: Boy Samina Weekes [id number] Date: 06/06/2014 Reason for consult: Follow-up assessment   Maternal Data Has patient been taught Hand Expression?: Yes  Feeding    LATCH Score/Interventions                      Lactation Tools Discussed/Used Initiated by:: MBU RN Date initiated:: 06/07/14   Consult Status Consult Status: Follow-up Date: 06/07/14 Follow-up type: In-patient    Keesha Pellum, Justine Null 06/06/2014, 8:29 PM

## 2014-06-06 NOTE — Progress Notes (Signed)
PPD #1 No problems, some pain at laceration site Afeb, VSS Fundus firm, NT at U-1 Perineum-repair intact Continue routine postpartum care, will d/c Unasyn since afebrile

## 2014-06-07 ENCOUNTER — Ambulatory Visit: Payer: Self-pay

## 2014-06-07 MED ORDER — IBUPROFEN 600 MG PO TABS: 600.0000 mg | ORAL_TABLET | Freq: Four times a day (QID) | ORAL | Status: DC

## 2014-06-07 MED ORDER — OXYCODONE-ACETAMINOPHEN 5-325 MG PO TABS: 1.0000 | ORAL_TABLET | ORAL | Status: DC | PRN

## 2014-06-07 NOTE — Discharge Instructions (Signed)
As per discharge pamphlet °

## 2014-06-07 NOTE — Lactation Note (Signed)
This note was copied from the chart of [place]. Lactation Consultation Note F/U on BF d/t 8% weight loss. Mom using NS w/formula supplementing in NS. Baby BF much better and isn't as fussy. Mom stated had a great BF this last one. Baby fell asleep in side lying position at the breast. Having good pee's and poop's. Mom calm and happy w/feedings at this time. Reviewed the feeding amount of formula supplementing.  Patient Name: Jill Murillo [id number] Date: 06/07/2014     Maternal Data    Feeding    LATCH Score/Interventions                      Lactation Tools Discussed/Used     Consult Status      Jill Murillo, [name] 06/07/2014, 4:09 AM

## 2014-06-07 NOTE — Lactation Note (Signed)
This note was copied from the chart of [place]. Lactation Consultation Note  Patient Name: Boy Kemara Quigley [id number] Date: 06/07/2014 per mom per [facility] RN,  baby last fed at 46 for 20 mins.  Reason for consult: Follow-up assessment;Infant weight loss;Other (Comment) (per mom breastfeeding is going much better with the nipple shield with latching ) Mom mentioned the [facility] that saw her yesterday was very helpful, and the [facility] Plan is working well. Still using the nipple shields for latching , nipples alittle tender , instructed on the use comfort gels. And sore nipple and engorgement prevention and tx . Mom receptive to scheduling and O/P apt - Tuesday 9/1 at 1pm , apt reminder given to mom.  Mother informed of post-discharge support and given phone number to the lactation department, including services for phone call assistance; out-patient appointments; and breastfeeding support group.  List of other breastfeeding resources in the community given in the handout. Encouraged mother to call for problems or concerns related to breastfeeding.   Maternal Data    Feeding Feeding Type: Breast Fed Length of feed: 20 min  LATCH Score/Interventions                Intervention(s): Breastfeeding basics reviewed (see LC note )     Lactation Tools Discussed/Used Pump Review:  (teaching already done )   Consult Status Consult Status: Follow-up Date: 06/12/14 (at 1pm ) Follow-up type: Out-patient    Myer Haff 06/07/2014, 12:12 PM

## 2014-06-07 NOTE — Discharge Summary (Signed)
Obstetric Discharge Summary Reason for Admission: onset of labor Prenatal Procedures: none Intrapartum Procedures: spontaneous vaginal delivery and antibiotics for chorioamnionitis Postpartum Procedures: none Complications-Operative and Postpartum: 2nd degree perineal laceration Hemoglobin  Date Value Ref Range Status  06/05/2014 12.1  12.0 - 15.0 g/dL Final     HCT  Date Value Ref Range Status  06/05/2014 35.4* 36.0 - 46.0 % Final    Physical Exam:  General: alert Lochia: appropriate Uterine Fundus: firm   Discharge Diagnoses: Term Pregnancy-delivered  Discharge Information: Date: 06/07/2014 Activity: pelvic rest Diet: routine Medications: Ibuprofen and Percocet Condition: stable Instructions: refer to practice specific booklet Discharge to: home Follow-up Information   Follow up with Saliou Barnier D, MD. Schedule an appointment as soon as possible for a visit in 6 weeks.   Specialty:  Obstetrics and Gynecology   Contact information:   [address] [place] [address] [phone]       Newborn Data: Live born female  Birth Weight: 6 lb 15.8 oz (3170 g) APGAR: 6, 8  Home with mother.  Jill Murillo D 06/07/2014, 8:15 AM

## 2014-06-07 NOTE — Progress Notes (Signed)
PPD #2 Doing well, small clot this am Afeb, VSS Fundus firm D/c home

## 2014-06-12 ENCOUNTER — Ambulatory Visit (HOSPITAL_COMMUNITY)
Admit: 2014-06-12 | Discharge: 2014-06-12 | Disposition: A | Discharge status: Home / Self Care | Payer: 59 | Attending: Obstetrics and Gynecology | Admitting: Obstetrics and Gynecology

## 2014-06-12 NOTE — Lactation Note (Signed)
Lactation Consult  Mother's reason for visit:  Follow up from hospital Visit Type:  Feeding assessment Appointment Notes:  Using nipple shield Consult:  Initial Lactation Consultant:  Ave Filter  ________________________________________________________________________   46 Name: Jill Murillo  Date of Birth: 06/05/2014  Pediatrician: Dr. Jimmye Norman Gender: female  Gestational Age: [redacted]w[redacted]d (At Birth)  Birth Weight: 6 lb 15.8 oz (3170 g)  Weight at Discharge: Weight: 6 lb 7.4 oz (2930 g) Date of Discharge: 06/07/2014  [facility] Weights   06/05/14 0013 06/06/14 0038 06/07/14 0028  Weight: 6 lb 15.8 oz (3170 g) 6 lb 10.9 oz (3030 g) 6 lb 7.4 oz (2930 g)  Last weight taken from location outside of [facility]:  Weight today: 7-2.9  ________________________________________________________________________  Mother's Name: Jill Murillo Type of delivery:  Vaginal Breastfeeding Experience:  First baby Maternal Medical Conditions:  Thyroid Maternal Medications:  Levothyroxine, PNV'S  ________________________________________________________________________  Breastfeeding History (Post Discharge)  Frequency of breastfeeding:  Attempts only Duration of feeding:  n/a  Supplementation  Formula:  Volume 66ml Frequency:  4 times per day Total volume per day:  14ml       Brand: Similac  Breastmilk:  Volume 60-44ml Frequency:  Every 3 hours Total volume per day:  480+ml  Method:  Bottle,   Pumping  Type of pump:  Ameda Frequency:  Every 3 hours Volume:  60-69mls from right and 15-30 mls from left  Infant Intake and Output Assessment  Voids:  6-8 in 24 hrs.  Color:  Clear yellow Stools:  6 in 24 hrs.  Color:  Yellow  ________________________________________________________________________  Maternal Breast Assessment  Breast:  Full Nipple:  Flat Pain level:  0 Pain interventions:  N/A  _______________________________________________________________________ Feeding  Assessment/Evaluation  Mom and 7 day old infant here for feeding assist.  Mom teary eyed and verbalizes disappointment that baby isn't latching even with nipple shield at home.  Assisted with positioning baby in cross cradle hold.  Mom can easily hand express and fill shield with breast milk.  After a few attempts baby latched well using the 24 mm nipple shield and nursed for 20 minutes actively with many swallows.  When baby came off first breast assisted with latching to opposite breast with minimal assist.  Baby nursed for additional 15 minutes and transferred a total of 78 mls.  Mom encouraged.  Discussed techniques her family can assist with feedings.  Encouraged to call with concerns and attend breastfeeding support group when possible.  Initial feeding assessment:  Infant's oral assessment:  WNL  Positioning:  Cross cradle Right breast/LEFT BREAST  LATCH documentation:  Latch:  2 = Grasps breast easily, tongue down, lips flanged, rhythmical sucking./WITH 24 MM NIPPLE SHIELD  Audible swallowing:  2 = Spontaneous and intermittent  Type of nipple:  1 = Flat  Comfort (Breast/Nipple):  2 = Soft / non-tender  Hold (Positioning):  1 = Assistance needed to correctly position infant at breast and maintain latch  LATCH score:  8  Attached assessment:  Deep  Lips flanged:  Yes.    Lips untucked:  No.  Suck assessment:  Nutritive  Tools:  Nipple shield 24 mm Instructed on use and cleaning of tool:  Yes.    Pre-feed weight:  3258 g   Post-feed weight:  3336 g  Amount transferred:  78 ml Amount supplemented: 0 ml      Total amount transferred:  78 ml Total supplement given: 0 ml

## 2014-08-13 ENCOUNTER — Encounter (HOSPITAL_COMMUNITY): Payer: Self-pay | Admitting: *Deleted

## 2017-09-04 ENCOUNTER — Other Ambulatory Visit: Payer: Self-pay

## 2017-09-04 ENCOUNTER — Emergency Department (HOSPITAL_BASED_OUTPATIENT_CLINIC_OR_DEPARTMENT_OTHER): Payer: 59

## 2017-09-04 ENCOUNTER — Emergency Department (HOSPITAL_BASED_OUTPATIENT_CLINIC_OR_DEPARTMENT_OTHER)
Admission: EM | Admit: 2017-09-04 | Discharge: 2017-09-04 | Disposition: A | Discharge status: Home / Self Care | Payer: 59 | Attending: Emergency Medicine | Admitting: Emergency Medicine

## 2017-09-04 ENCOUNTER — Encounter (HOSPITAL_BASED_OUTPATIENT_CLINIC_OR_DEPARTMENT_OTHER): Payer: Self-pay | Admitting: Emergency Medicine

## 2017-09-04 DIAGNOSIS — S6992XA Unspecified injury of left wrist, hand and finger(s), initial encounter: Secondary | ICD-10-CM | POA: Diagnosis present

## 2017-09-04 DIAGNOSIS — S61213A Laceration without foreign body of left middle finger without damage to nail, initial encounter: Secondary | ICD-10-CM | POA: Diagnosis not present

## 2017-09-04 DIAGNOSIS — Y9389 Activity, other specified: Secondary | ICD-10-CM | POA: Insufficient documentation

## 2017-09-04 DIAGNOSIS — E039 Hypothyroidism, unspecified: Secondary | ICD-10-CM | POA: Insufficient documentation

## 2017-09-04 DIAGNOSIS — W540XXA Bitten by dog, initial encounter: Secondary | ICD-10-CM | POA: Insufficient documentation

## 2017-09-04 DIAGNOSIS — Y999 Unspecified external cause status: Secondary | ICD-10-CM | POA: Insufficient documentation

## 2017-09-04 DIAGNOSIS — Z79899 Other long term (current) drug therapy: Secondary | ICD-10-CM | POA: Insufficient documentation

## 2017-09-04 DIAGNOSIS — Y929 Unspecified place or not applicable: Secondary | ICD-10-CM | POA: Insufficient documentation

## 2017-09-04 MED ORDER — AMOXICILLIN-POT CLAVULANATE 875-125 MG PO TABS: 1.0000 | ORAL_TABLET | Freq: Two times a day (BID) | ORAL | 0 refills | Status: DC

## 2017-09-04 MED ORDER — LIDOCAINE HCL 2 % IJ SOLN
5.0000 mL | Freq: Once | INTRAMUSCULAR | Status: AC
  Administered 2017-09-04: 100 mg

## 2017-09-04 NOTE — ED Notes (Signed)
ED MD in to do lac repair

## 2017-09-04 NOTE — Discharge Instructions (Signed)
Return if it becomes red or starts draining pus

## 2017-09-04 NOTE — ED Triage Notes (Signed)
Patient states that she was bit by her boxer  - patient reports that they are up to date on their shots

## 2017-09-04 NOTE — ED Provider Notes (Signed)
[place] EMERGENCY DEPARTMENT Provider Note   CSN: [id number] Arrival date & time: 09/04/17  1113     History   Chief Complaint Chief Complaint  Patient presents with  . Animal Bite    HPI Jill Murillo is a 36 y.o. female.  Patient states today that she was trying to break up HER-2 dogs who were fighting over food and one accidentally bit her finger.   The history is provided by the patient.  Animal Bite  Contact animal:  Dog Location:  Finger Finger injury location:  L long finger Time since incident:  1 hour Pain details:    Quality:  Sharp and sore   Severity:  Moderate   Timing:  Constant   Progression:  Unchanged Incident location:  Home Provoked: provoked   Notifications:  None Animal's rabies vaccination status:  Up to date Animal in possession: yes   Tetanus status:  Up to date Relieved by:  Cold compresses Worsened by:  Activity Ineffective treatments:  None tried Associated symptoms: no numbness     Past Medical History:  Diagnosis Date  . Hypothyroid     Patient Active Problem List   Diagnosis Date Noted  . Acute chorioamnionitis 06/05/2014  . SVD (spontaneous vaginal delivery) 06/05/2014  . Normal pregnancy 06/04/2014    Past Surgical History:  Procedure Laterality Date  . NO PAST SURGERIES    . WISDOM TOOTH EXTRACTION      OB History    Gravida Para Term Preterm AB Living   '3 1 1   2 1   ' SAB TAB Ectopic Multiple Live Births   2       1       Home Medications    Prior to Admission medications   Medication Sig Start Date End Date Taking? Authorizing Provider  ibuprofen (ADVIL,MOTRIN) 600 MG tablet Take 1 tablet (600 mg total) by mouth every 6 (six) hours. 06/07/14   Meisinger, Sherren Mocha, MD  levothyroxine (SYNTHROID, LEVOTHROID) 137 MCG tablet Take 137 mcg by mouth daily before breakfast.    [provider]  oxyCODONE-acetaminophen (PERCOCET/ROXICET) 5-325 MG per tablet Take 1-2 tablets by mouth every 4 (four)  hours as needed for severe pain (moderate - severe pain). 06/07/14   Meisinger, Sherren Mocha, MD  Prenatal Vit-Fe Fumarate-FA (PRENATAL MULTIVITAMIN) TABS tablet Take 1 tablet by mouth daily at 12 noon.    [provider]    Family History Family History  Problem Relation Age of Onset  . Hypertension Father     Social History Social History   Tobacco Use  . Smoking status: Never Smoker  . Smokeless tobacco: Never Used  Substance Use Topics  . Alcohol use: No  . Drug use: No     Allergies   Patient has no known allergies.   Review of Systems Review of Systems  Neurological: Negative for numbness.  All other systems reviewed and are negative.    Physical Exam Updated Vital Signs BP (!) 139/93 (BP Location: Right Arm)   Pulse 66   Temp 98.2 F (36.8 C) (Oral)   Resp 20   Ht '5\' 4"'  (1.626 m)   Wt 102.1 kg (225 lb)   SpO2 100%   BMI 38.62 kg/m   Physical Exam  Constitutional: She is oriented to person, place, and time. She appears well-developed and well-nourished. No distress.  HENT:  Head: Normocephalic.  Cardiovascular: Normal rate.  Pulmonary/Chest: Effort normal.  Musculoskeletal: She exhibits tenderness.  Hands: Neurological: She is alert and oriented to person, place, and time.  Skin: Skin is warm and dry.  Psychiatric: She has a normal mood and affect. Her behavior is normal.  Nursing note and vitals reviewed.    ED Treatments / Results  Labs (all labs ordered are listed, but only abnormal results are displayed) Labs Reviewed - No data to display  EKG  EKG Interpretation None       Radiology Dg Finger Middle Left  Result Date: 09/04/2017 CLINICAL DATA:  36 year old female with the plate to left middle finger. Open wound over middle phalanx posteriorly. EXAM: LEFT MIDDLE FINGER 2+V COMPARISON:  None. FINDINGS: There is no evidence of fracture or dislocation. There is no evidence of arthropathy or other focal bone abnormality. Mild  soft tissue irregularity is seen along the dorsal aspect at the middle phalanx. IMPRESSION: Mild soft tissue irregularity along the dorsal aspect at the middle phalanx of the second digit. No underlying osseous abnormality. Electronically Signed   By: Kristopher Oppenheim M.D.   On: 09/04/2017 12:01    Procedures Procedures (including critical care time)  LACERATION REPAIR Performed by: [employer] Authorized by: Blanchie Dessert Consent: Verbal consent obtained. Risks and benefits: risks, benefits and alternatives were discussed Consent given by: patient Patient identity confirmed: provided demographic data Prepped and Draped in normal sterile fashion Wound explored  Laceration Location: left middle finger  Laceration Length: 2cm  No Foreign Bodies seen or palpated  Anesthesia: digital block  Local anesthetic: lidocaine 2% without epinephrine  Anesthetic total: 2 ml  Irrigation method: syringe Amount of cleaning: standard  Skin closure: 4.0 vicryl  Number of sutures: 2  Technique: simple interrupted  Patient tolerance: Patient tolerated the procedure well with no immediate complications.   Medications Ordered in ED Medications - No data to display   Initial Impression / Assessment and Plan / ED Course  I have reviewed the triage vital signs and the nursing notes.  Pertinent labs & imaging results that were available during my care of the patient were reviewed by me and considered in my medical decision making (see chart for details).     Patient presenting with dog bite to the left long finger that occurred prior to arrival.  V-shaped wound that is approximately 2 cm.  Patient has no evidence of tendon involvement and has full function of the finger.  Her tetanus shot is up-to-date.  Patient will be treated with antibiotics.  X-ray without acute findings.  Give the patient option of delayed wound healing versus loose closure and given the risk and benefit of  both.  12:40 PM Wound repair as above it was closed loosely and irrigated extensively.  Patient was started on Augmentin. Final Clinical Impressions(s) / ED Diagnoses   Final diagnoses:  Dog bite, initial encounter  Laceration of left middle finger without foreign body without damage to nail, initial encounter    ED Discharge Orders        Ordered    amoxicillin-clavulanate (AUGMENTIN) 875-125 MG tablet  Every 12 hours     09/04/17 1238       Blanchie Dessert, MD 09/04/17 1240

## 2018-03-30 LAB — OB RESULTS CONSOLE RUBELLA ANTIBODY, IGM: Rubella: IMMUNE

## 2018-03-30 LAB — OB RESULTS CONSOLE ANTIBODY SCREEN: Antibody Screen: NEGATIVE

## 2018-03-30 LAB — OB RESULTS CONSOLE GC/CHLAMYDIA
CHLAMYDIA, DNA PROBE: NEGATIVE
GC PROBE AMP, GENITAL: NEGATIVE

## 2018-03-30 LAB — OB RESULTS CONSOLE HEPATITIS B SURFACE ANTIGEN: Hepatitis B Surface Ag: NEGATIVE

## 2018-03-30 LAB — OB RESULTS CONSOLE ABO/RH: RH Type: POSITIVE

## 2018-03-30 LAB — OB RESULTS CONSOLE RPR: RPR: NONREACTIVE

## 2018-03-30 LAB — OB RESULTS CONSOLE HIV ANTIBODY (ROUTINE TESTING): HIV: NONREACTIVE

## 2018-10-06 LAB — OB RESULTS CONSOLE GBS: GBS: POSITIVE

## 2018-10-12 NOTE — L&D Delivery Note (Signed)
Delivery Note Pt pushed very well with 3 contractions for delivery.  At 3:40 PM a viable and healthy female was delivered via Vaginal, Spontaneous (Presentation: OA;LOT  ).  APGAR: 8, 9; weight P .   Placenta status: delivered, intact .  Cord: 3V  with the following complications: nuchal.    Anesthesia:  epidural Episiotomy: None Lacerations: 2nd degree;Perineal Suture Repair: 3.0 vicryl rapide Est. Blood Loss (mL): 131cc  Mom to postpartum.  Baby to Couplet care / Skin to Skin.  Desteny Freeman Bovard-Stuckert 10/25/2018, 4:22 PM  D/w pt r/b/a of PP BTL, inc r/b/a and process- will proceed.  Br/A+/RI/Tdap in PNC/Contra BTL

## 2018-10-17 ENCOUNTER — Telehealth (HOSPITAL_COMMUNITY): Payer: Self-pay | Admitting: *Deleted

## 2018-10-17 ENCOUNTER — Encounter (HOSPITAL_COMMUNITY): Payer: Self-pay | Admitting: *Deleted

## 2018-10-17 NOTE — Telephone Encounter (Signed)
Preadmission screen  

## 2018-10-24 ENCOUNTER — Encounter (HOSPITAL_COMMUNITY): Payer: Self-pay

## 2018-10-24 DIAGNOSIS — Z3483 Encounter for supervision of other normal pregnancy, third trimester: Secondary | ICD-10-CM

## 2018-10-24 HISTORY — DX: Encounter for supervision of other normal pregnancy, third trimester: Z34.83

## 2018-10-24 NOTE — H&P (Signed)
Jill Murillo is a 38 y.o. female [phone] at 80+ for IOLgiven term and favorable cervix.  EDC by LMP cw first tri [place].  Pregnancy complicated by AMA, hypothyroid and maternal obesity.  EDC by LMP cw first trimester [place].  D/W pt IOL inc r/b/a and process.  RFeceived Tdap and Flu vaccine in [facility].    OB History    Gravida  4   Para  1   Term  1   Preterm      AB  2   Living  1     SAB  2   TAB      Ectopic      Multiple      Live Births  1         SAB x2 G1 8/15 - SVD female 6#15 G2 present  No abn pap No STD  Past Medical History:  Diagnosis Date  . Hypothyroid   . Normal pregnancy in multigravida in third trimester 10/24/2018  Gout  Past Surgical History:  Procedure Laterality Date  . NO PAST SURGERIES    . WISDOM TOOTH EXTRACTION     Family History: family history includes Hypertension in her father.CAD, Breast CA, DM, uterine CA, colon CA, DM Social History:  reports that she has never smoked. She has never used smokeless tobacco. She reports that she does not drink alcohol or use drugs.married, [employer]  Meds levothyroxine, PNV All NKDA     Maternal Diabetes: No Genetic Screening: Normal Maternal Ultrasounds/Referrals: Normal Fetal Ultrasounds or other Referrals:  None Maternal Substance Abuse:  No Significant Maternal Medications:  None Significant Maternal Lab Results:  Lab values include: Group B Strep positive Other Comments:  None  Review of Systems  Constitutional: Negative.   HENT: Negative.   Eyes: Negative.   Respiratory: Negative.   Cardiovascular: Negative.   Gastrointestinal: Negative.   Genitourinary: Negative.   Musculoskeletal: Negative.   Skin: Negative.   Neurological: Negative.   Psychiatric/Behavioral: Negative.    Maternal Medical History:  Contractions: Frequency: irregular.   Perceived severity is moderate.    Fetal activity: Perceived fetal activity is normal.    Prenatal Complications - Diabetes: none.      Last  menstrual period 01/20/2018, unknown if currently breastfeeding. Maternal Exam:  Uterine Assessment: Contraction strength is moderate.  Contraction frequency is irregular.   Abdomen: Patient reports no abdominal tenderness. Fundal height is appropriate for gestation.   Estimated fetal weight is 7-8#.   Fetal presentation: vertex  Introitus: Normal vulva. Normal vagina.    Physical Exam  Constitutional: She is oriented to person, place, and time. She appears well-developed and well-nourished.  HENT:  Head: Normocephalic and atraumatic.  Cardiovascular: Normal rate and regular rhythm.  Respiratory: Effort normal and breath sounds normal. No respiratory distress. She has no wheezes.  GI: Soft. Bowel sounds are normal. She exhibits no distension. There is no abdominal tenderness.  Genitourinary:    Vulva normal.   Musculoskeletal: Normal range of motion.  Neurological: She is alert and oriented to person, place, and time.  Skin: Skin is warm and dry.  Psychiatric: She has a normal mood and affect. Her behavior is normal.    Prenatal labs: ABO, Rh: A/Positive/-- (06/19 0000) Antibody: Negative (06/19 0000) Rubella: Immune (06/19 0000) RPR: Nonreactive (06/19 0000)  HBsAg: Negative (06/19 0000)  HIV: Non-reactive (06/19 0000)  GBS:   positive  Hgb 12.9/Plt 305/GC neg/Chl neg/ Ur  Cx neg/Varicella immune/nl thyroid/   Essential panel neg - CF  neg/SMA neg/Fragile X neg [place] nl anat, ant plac, female  Short cervix early scan - got progesterone  First tri [place] nl NT, nl first tri [place] - CL = WNL  Nl anat, ant plac, female  Assessment/Plan: 37yo [id number] at 39+ for IOL AROM, augment with pitocin Epidural prn Expect SVD Desires BTL - d/w pt r/b/a - will proceed after delivery if able   Adolfo Granieri Bovard-Stuckert 10/24/2018, 11:02 PM

## 2018-10-25 ENCOUNTER — Encounter (HOSPITAL_COMMUNITY)
Admission: RE | Disposition: A | Discharge status: Home / Self Care | Payer: Self-pay | Source: Home / Self Care | Attending: Obstetrics and Gynecology

## 2018-10-25 ENCOUNTER — Inpatient Hospital Stay (HOSPITAL_COMMUNITY): Payer: No Typology Code available for payment source | Admitting: Anesthesiology

## 2018-10-25 ENCOUNTER — Inpatient Hospital Stay (HOSPITAL_COMMUNITY)
Admission: RE | Admit: 2018-10-25 | Discharge: 2018-10-27 | DRG: 798 | Disposition: A | Discharge status: Home / Self Care | Payer: No Typology Code available for payment source | Attending: Obstetrics and Gynecology | Admitting: Obstetrics and Gynecology

## 2018-10-25 ENCOUNTER — Encounter (HOSPITAL_COMMUNITY): Payer: Self-pay

## 2018-10-25 DIAGNOSIS — Z87891 Personal history of nicotine dependence: Secondary | ICD-10-CM

## 2018-10-25 DIAGNOSIS — Z9851 Tubal ligation status: Secondary | ICD-10-CM

## 2018-10-25 DIAGNOSIS — E039 Hypothyroidism, unspecified: Secondary | ICD-10-CM | POA: Diagnosis present

## 2018-10-25 DIAGNOSIS — Z3A39 39 weeks gestation of pregnancy: Secondary | ICD-10-CM | POA: Diagnosis not present

## 2018-10-25 DIAGNOSIS — Z302 Encounter for sterilization: Secondary | ICD-10-CM | POA: Diagnosis not present

## 2018-10-25 DIAGNOSIS — O99824 Streptococcus B carrier state complicating childbirth: Secondary | ICD-10-CM | POA: Diagnosis present

## 2018-10-25 DIAGNOSIS — Z3483 Encounter for supervision of other normal pregnancy, third trimester: Secondary | ICD-10-CM

## 2018-10-25 DIAGNOSIS — O99284 Endocrine, nutritional and metabolic diseases complicating childbirth: Secondary | ICD-10-CM | POA: Diagnosis present

## 2018-10-25 DIAGNOSIS — O99214 Obesity complicating childbirth: Secondary | ICD-10-CM | POA: Diagnosis present

## 2018-10-25 HISTORY — PX: TUBAL LIGATION: SHX77

## 2018-10-25 HISTORY — DX: Tubal ligation status: Z98.51

## 2018-10-25 HISTORY — DX: Encounter for supervision of other normal pregnancy, third trimester: Z34.83

## 2018-10-25 LAB — RPR: RPR Ser Ql: NONREACTIVE

## 2018-10-25 LAB — CBC
HEMATOCRIT: 37.7 % (ref 36.0–46.0)
HEMOGLOBIN: 12 g/dL (ref 12.0–15.0)
MCH: 27.6 pg (ref 26.0–34.0)
MCHC: 31.8 g/dL (ref 30.0–36.0)
MCV: 86.7 fL (ref 80.0–100.0)
Platelets: 227 10*3/uL (ref 150–400)
RBC: 4.35 MIL/uL (ref 3.87–5.11)
RDW: 15.1 % (ref 11.5–15.5)
WBC: 9 10*3/uL (ref 4.0–10.5)
nRBC: 0 % (ref 0.0–0.2)

## 2018-10-25 LAB — TYPE AND SCREEN
ABO/RH(D): A POS
Antibody Screen: NEGATIVE

## 2018-10-25 SURGERY — LIGATION, FALLOPIAN TUBE, POSTPARTUM
Anesthesia: Epidural

## 2018-10-25 MED ORDER — LIDOCAINE HCL (PF) 1 % IJ SOLN: 30.0000 mL | INTRAMUSCULAR | Status: DC | PRN

## 2018-10-25 MED ORDER — MIDAZOLAM HCL 2 MG/2ML IJ SOLN
INTRAMUSCULAR | Status: AC
  Filled 2018-10-25: qty 2

## 2018-10-25 MED ORDER — DIBUCAINE 1 % RE OINT: 1.0000 "application " | TOPICAL_OINTMENT | RECTAL | Status: DC | PRN

## 2018-10-25 MED ORDER — ZOLPIDEM TARTRATE 5 MG PO TABS: 5.0000 mg | ORAL_TABLET | Freq: Every evening | ORAL | Status: DC | PRN

## 2018-10-25 MED ORDER — OXYCODONE-ACETAMINOPHEN 5-325 MG PO TABS: 1.0000 | ORAL_TABLET | ORAL | Status: DC | PRN

## 2018-10-25 MED ORDER — SODIUM BICARBONATE 8.4 % IV SOLN
INTRAVENOUS | Status: DC | PRN
  Administered 2018-10-25 (×2): 5 mL via EPIDURAL

## 2018-10-25 MED ORDER — ONDANSETRON HCL 4 MG/2ML IJ SOLN
INTRAMUSCULAR | Status: AC
  Filled 2018-10-25: qty 2

## 2018-10-25 MED ORDER — EPHEDRINE 5 MG/ML INJ: 10.0000 mg | INTRAVENOUS | Status: DC | PRN

## 2018-10-25 MED ORDER — LACTATED RINGERS IV SOLN: INTRAVENOUS | Status: DC

## 2018-10-25 MED ORDER — HYDROMORPHONE HCL 1 MG/ML IJ SOLN: 0.2500 mg | INTRAMUSCULAR | Status: DC | PRN

## 2018-10-25 MED ORDER — OXYCODONE HCL 5 MG PO TABS
5.0000 mg | ORAL_TABLET | ORAL | Status: DC | PRN
  Administered 2018-10-26 – 2018-10-27 (×5): 5 mg via ORAL
  Filled 2018-10-25 (×5): qty 1

## 2018-10-25 MED ORDER — KETOROLAC TROMETHAMINE 30 MG/ML IJ SOLN
INTRAMUSCULAR | Status: DC | PRN
  Administered 2018-10-25: 30 mg via INTRAVENOUS

## 2018-10-25 MED ORDER — TERBUTALINE SULFATE 1 MG/ML IJ SOLN: 0.2500 mg | Freq: Once | INTRAMUSCULAR | Status: DC | PRN

## 2018-10-25 MED ORDER — LIDOCAINE-EPINEPHRINE (PF) 2 %-1:200000 IJ SOLN
INTRAMUSCULAR | Status: AC
  Filled 2018-10-25: qty 60

## 2018-10-25 MED ORDER — PRENATAL MULTIVITAMIN CH
1.0000 | ORAL_TABLET | Freq: Every day | ORAL | Status: DC
  Administered 2018-10-26: 1 via ORAL
  Filled 2018-10-25: qty 1

## 2018-10-25 MED ORDER — WITCH HAZEL-GLYCERIN EX PADS: 1.0000 "application " | MEDICATED_PAD | CUTANEOUS | Status: DC | PRN

## 2018-10-25 MED ORDER — LEVOTHYROXINE SODIUM 150 MCG PO TABS
150.0000 ug | ORAL_TABLET | Freq: Every day | ORAL | Status: DC
  Administered 2018-10-26 – 2018-10-27 (×2): 150 ug via ORAL
  Filled 2018-10-25 (×2): qty 1

## 2018-10-25 MED ORDER — DIPHENHYDRAMINE HCL 25 MG PO CAPS: 25.0000 mg | ORAL_CAPSULE | Freq: Four times a day (QID) | ORAL | Status: DC | PRN

## 2018-10-25 MED ORDER — OXYTOCIN BOLUS FROM INFUSION: 500.0000 mL | Freq: Once | INTRAVENOUS | Status: DC

## 2018-10-25 MED ORDER — COCONUT OIL OIL
1.0000 "application " | TOPICAL_OIL | Status: DC | PRN
  Administered 2018-10-26: 1 via TOPICAL
  Filled 2018-10-25: qty 120

## 2018-10-25 MED ORDER — DEXAMETHASONE SODIUM PHOSPHATE 4 MG/ML IJ SOLN
INTRAMUSCULAR | Status: DC | PRN
  Administered 2018-10-25: 4 mg via INTRAVENOUS

## 2018-10-25 MED ORDER — PENICILLIN G 3 MILLION UNITS IVPB - SIMPLE MED
3.0000 10*6.[IU] | INTRAVENOUS | Status: DC
  Administered 2018-10-25: 3 10*6.[IU] via INTRAVENOUS
  Filled 2018-10-25: qty 100

## 2018-10-25 MED ORDER — LACTATED RINGERS IV SOLN
INTRAVENOUS | Status: DC | PRN
  Administered 2018-10-25: 18:00:00 via INTRAVENOUS

## 2018-10-25 MED ORDER — ONDANSETRON HCL 4 MG/2ML IJ SOLN: 4.0000 mg | INTRAMUSCULAR | Status: DC | PRN

## 2018-10-25 MED ORDER — OXYTOCIN 40 UNITS IN NORMAL SALINE INFUSION - SIMPLE MED: 2.5000 [IU]/h | INTRAVENOUS | Status: DC

## 2018-10-25 MED ORDER — SOD CITRATE-CITRIC ACID 500-334 MG/5ML PO SOLN
30.0000 mL | ORAL | Status: DC | PRN
  Administered 2018-10-25: 30 mL via ORAL
  Filled 2018-10-25: qty 15

## 2018-10-25 MED ORDER — KETOROLAC TROMETHAMINE 30 MG/ML IJ SOLN
INTRAMUSCULAR | Status: AC
  Filled 2018-10-25: qty 1

## 2018-10-25 MED ORDER — NALBUPHINE HCL 10 MG/ML IJ SOLN
5.0000 mg | INTRAMUSCULAR | Status: DC | PRN
  Administered 2018-10-25: 5 mg via INTRAVENOUS
  Filled 2018-10-25: qty 1

## 2018-10-25 MED ORDER — FENTANYL 2.5 MCG/ML BUPIVACAINE 1/10 % EPIDURAL INFUSION (WH - ANES)
14.0000 mL/h | INTRAMUSCULAR | Status: DC | PRN
  Administered 2018-10-25: 14 mL/h via EPIDURAL
  Filled 2018-10-25: qty 100

## 2018-10-25 MED ORDER — PROMETHAZINE HCL 25 MG/ML IJ SOLN: 6.2500 mg | INTRAMUSCULAR | Status: DC | PRN

## 2018-10-25 MED ORDER — ACETAMINOPHEN 325 MG PO TABS: 650.0000 mg | ORAL_TABLET | ORAL | Status: DC | PRN

## 2018-10-25 MED ORDER — OXYCODONE-ACETAMINOPHEN 5-325 MG PO TABS: 2.0000 | ORAL_TABLET | ORAL | Status: DC | PRN

## 2018-10-25 MED ORDER — OXYCODONE HCL 5 MG PO TABS: 10.0000 mg | ORAL_TABLET | ORAL | Status: DC | PRN

## 2018-10-25 MED ORDER — SENNOSIDES-DOCUSATE SODIUM 8.6-50 MG PO TABS
2.0000 | ORAL_TABLET | ORAL | Status: DC
  Administered 2018-10-25 – 2018-10-26 (×2): 2 via ORAL
  Filled 2018-10-25 (×2): qty 2

## 2018-10-25 MED ORDER — DIPHENHYDRAMINE HCL 50 MG/ML IJ SOLN
12.5000 mg | INTRAMUSCULAR | Status: DC | PRN
  Administered 2018-10-25: 12.5 mg via INTRAVENOUS
  Filled 2018-10-25: qty 1

## 2018-10-25 MED ORDER — OXYCODONE HCL 5 MG PO TABS: 5.0000 mg | ORAL_TABLET | Freq: Once | ORAL | Status: DC | PRN

## 2018-10-25 MED ORDER — OXYCODONE HCL 5 MG/5ML PO SOLN: 5.0000 mg | Freq: Once | ORAL | Status: DC | PRN

## 2018-10-25 MED ORDER — FENTANYL CITRATE (PF) 250 MCG/5ML IJ SOLN
INTRAMUSCULAR | Status: AC
  Filled 2018-10-25: qty 5

## 2018-10-25 MED ORDER — LACTATED RINGERS IV SOLN
INTRAVENOUS | Status: DC
  Administered 2018-10-25 (×2): via INTRAVENOUS

## 2018-10-25 MED ORDER — LACTATED RINGERS IV SOLN: 500.0000 mL | Freq: Once | INTRAVENOUS | Status: DC

## 2018-10-25 MED ORDER — ACETAMINOPHEN 325 MG PO TABS
650.0000 mg | ORAL_TABLET | ORAL | Status: DC | PRN
  Administered 2018-10-25: 650 mg via ORAL
  Filled 2018-10-25: qty 2

## 2018-10-25 MED ORDER — BENZOCAINE-MENTHOL 20-0.5 % EX AERO: 1.0000 "application " | INHALATION_SPRAY | CUTANEOUS | Status: DC | PRN

## 2018-10-25 MED ORDER — MIDAZOLAM HCL 5 MG/5ML IJ SOLN
INTRAMUSCULAR | Status: DC | PRN
  Administered 2018-10-25: 1 mg via INTRAVENOUS

## 2018-10-25 MED ORDER — PHENYLEPHRINE 40 MCG/ML (10ML) SYRINGE FOR IV PUSH (FOR BLOOD PRESSURE SUPPORT)
80.0000 ug | PREFILLED_SYRINGE | INTRAVENOUS | Status: DC | PRN
  Filled 2018-10-25: qty 10

## 2018-10-25 MED ORDER — SODIUM BICARBONATE 8.4 % IV SOLN
INTRAVENOUS | Status: AC
  Filled 2018-10-25: qty 50

## 2018-10-25 MED ORDER — LACTATED RINGERS IV SOLN: 500.0000 mL | INTRAVENOUS | Status: DC | PRN

## 2018-10-25 MED ORDER — FENTANYL CITRATE (PF) 100 MCG/2ML IJ SOLN
INTRAMUSCULAR | Status: DC | PRN
  Administered 2018-10-25: 50 ug via INTRAVENOUS

## 2018-10-25 MED ORDER — ACETAMINOPHEN 10 MG/ML IV SOLN: 1000.0000 mg | Freq: Once | INTRAVENOUS | Status: DC | PRN

## 2018-10-25 MED ORDER — OXYTOCIN 40 UNITS IN NORMAL SALINE INFUSION - SIMPLE MED
1.0000 m[IU]/min | INTRAVENOUS | Status: DC
  Administered 2018-10-25: 2 m[IU]/min via INTRAVENOUS
  Filled 2018-10-25: qty 1000

## 2018-10-25 MED ORDER — DEXAMETHASONE SODIUM PHOSPHATE 4 MG/ML IJ SOLN
INTRAMUSCULAR | Status: AC
  Filled 2018-10-25: qty 1

## 2018-10-25 MED ORDER — PHENYLEPHRINE 40 MCG/ML (10ML) SYRINGE FOR IV PUSH (FOR BLOOD PRESSURE SUPPORT): 80.0000 ug | PREFILLED_SYRINGE | INTRAVENOUS | Status: DC | PRN

## 2018-10-25 MED ORDER — LIDOCAINE HCL (PF) 1 % IJ SOLN
INTRAMUSCULAR | Status: DC | PRN
  Administered 2018-10-25 (×2): 6 mL via EPIDURAL

## 2018-10-25 MED ORDER — ONDANSETRON HCL 4 MG/2ML IJ SOLN
4.0000 mg | Freq: Four times a day (QID) | INTRAMUSCULAR | Status: DC | PRN
  Administered 2018-10-25: 4 mg via INTRAVENOUS

## 2018-10-25 MED ORDER — BUPIVACAINE HCL (PF) 0.25 % IJ SOLN
INTRAMUSCULAR | Status: DC | PRN
  Administered 2018-10-25: 30 mL

## 2018-10-25 MED ORDER — ONDANSETRON HCL 4 MG PO TABS: 4.0000 mg | ORAL_TABLET | ORAL | Status: DC | PRN

## 2018-10-25 MED ORDER — IBUPROFEN 600 MG PO TABS
600.0000 mg | ORAL_TABLET | Freq: Four times a day (QID) | ORAL | Status: DC
  Administered 2018-10-25 – 2018-10-27 (×6): 600 mg via ORAL
  Filled 2018-10-25 (×6): qty 1

## 2018-10-25 MED ORDER — SODIUM CHLORIDE 0.9 % IV SOLN
5.0000 10*6.[IU] | Freq: Once | INTRAVENOUS | Status: AC
  Administered 2018-10-25: 5 10*6.[IU] via INTRAVENOUS
  Filled 2018-10-25: qty 5

## 2018-10-25 MED ORDER — SIMETHICONE 80 MG PO CHEW: 80.0000 mg | CHEWABLE_TABLET | ORAL | Status: DC | PRN

## 2018-10-25 MED ORDER — BUPIVACAINE HCL (PF) 0.25 % IJ SOLN
INTRAMUSCULAR | Status: AC
  Filled 2018-10-25: qty 30

## 2018-10-25 SURGICAL SUPPLY — 27 items
APL SKNCLS STERI-STRIP NONHPOA (GAUZE/BANDAGES/DRESSINGS) ×1
BENZOIN TINCTURE PRP APPL 2/3 (GAUZE/BANDAGES/DRESSINGS) ×2 IMPLANT
BLADE SURG 11 STRL SS (BLADE) ×2 IMPLANT
CLOTH BEACON ORANGE TIMEOUT ST (SAFETY) ×2 IMPLANT
DRSG OPSITE POSTOP 3X4 (GAUZE/BANDAGES/DRESSINGS) ×2 IMPLANT
DRSG OPSITE POSTOP 4X10 (GAUZE/BANDAGES/DRESSINGS) ×1 IMPLANT
DURAPREP 26ML APPLICATOR (WOUND CARE) ×2 IMPLANT
ELECT REM PT RETURN 9FT ADLT (ELECTROSURGICAL) ×2
ELECTRODE REM PT RTRN 9FT ADLT (ELECTROSURGICAL) ×1 IMPLANT
GLOVE BIO SURGEON STRL SZ 6.5 (GLOVE) ×4 IMPLANT
GLOVE BIOGEL PI IND STRL 7.0 (GLOVE) ×1 IMPLANT
GLOVE BIOGEL PI INDICATOR 7.0 (GLOVE) ×1
GOWN STRL REUS W/TWL LRG LVL3 (GOWN DISPOSABLE) ×4 IMPLANT
NEEDLE HYPO 22GX1.5 SAFETY (NEEDLE) IMPLANT
NS IRRIG 1000ML POUR BTL (IV SOLUTION) ×2 IMPLANT
PACK ABDOMINAL MINOR (CUSTOM PROCEDURE TRAY) ×2 IMPLANT
PENCIL BUTTON HOLSTER BLD 10FT (ELECTRODE) IMPLANT
PROTECTOR NERVE ULNAR (MISCELLANEOUS) ×2 IMPLANT
SPONGE LAP 4X18 RFD (DISPOSABLE) ×2 IMPLANT
STRIP CLOSURE SKIN 1/2X4 (GAUZE/BANDAGES/DRESSINGS) ×2 IMPLANT
SUT PLAIN 0 NONE (SUTURE) ×2 IMPLANT
SUT VIC AB 0 CT1 27 (SUTURE) ×2
SUT VIC AB 0 CT1 27XBRD ANBCTR (SUTURE) ×1 IMPLANT
SUT VIC AB 3-0 FS2 27 (SUTURE) ×2 IMPLANT
SYR CONTROL 10ML LL (SYRINGE) IMPLANT
TOWEL OR 17X24 6PK STRL BLUE (TOWEL DISPOSABLE) ×4 IMPLANT
TRAY FOLEY CATH SILVER 14FR (SET/KITS/TRAYS/PACK) ×2 IMPLANT

## 2018-10-25 NOTE — Op Note (Signed)
NAMEMAYSEL, MCCOLM MEDICAL RECORD OQ:[id number] ACCOUNT [id number] DATE OF BIRTH:21-Oct-1980 FACILITY: [facility] LOCATION: [facility] PHYSICIAN:Shed Nixon BOVARD-STUCKERT, MD  OPERATIVE REPORT  DATE OF PROCEDURE:  10/25/2018  PREOPERATIVE DIAGNOSIS:  Status post normal spontaneous vaginal delivery, desires permanent sterilization.  POSTOPERATIVE DIAGNOSIS:  Status post normal spontaneous vaginal delivery, desires permanent sterilization.  PROCEDURE:  Postpartum bilateral tubal ligation by bilateral salpingectomy.  SURGEON:  Thornell Sartorius, MD   ASSISTANT:  Lester Markesan, RNFA  ANESTHESIA:  Local and epidural.  ESTIMATED BLOOD LOSS:  Approximately 5 mL.  IV FLUID AND URINE OUTPUT:  Per anesthesia.  COMPLICATIONS:  None.  PATHOLOGY:  Bilateral tubal segments to pathology.  DESCRIPTION OF PROCEDURE:  After informed consent was reviewed with the patient and her family including risks, benefits and alternatives of the surgical procedure including but not limited to bleeding, infection, damage to surrounding organs as well as  the risk of ectopic pregnancy, her epidural was dosed and she was transported to the OR.  She was placed on table in supine position.  A Foley catheter had previously been placed.  She was then prepped and draped in the normal sterile fashion after an  appropriate timeout was performed.  An approximately 2-3 cm infraumbilical incision was made.  The fascia was cleared off with a hemostat and elevated with Kocher clamps.  It was then incised sharply both the anterior and posterior sheath.  It was  extended and the [employer] retractors were placed.  The bowel was packed away with a moist tape sponge that was tagged.  Initially, the patient was airplaned to the right.  The left tube was easily identified, followed out to the fimbriated end.  Using a  Claiborne Billings, it was isolated using Metzenbaum scissors.  It was excised and it was doubly ligated with plain gut suture.  This was noted to  be hemostatic.  The patient was then airplaned to the left.  Her right tube was easily identified, followed out to the  fimbriated, isolated with a Kelly clamp, doubly ligated with plain gut suture.  This was also noted to be hemostatic.  The tape sponge was then removed.  The fascia was reapproximated with 0 Vicryl in a running fashion.  The subcuticular adipose layer  was made hemostatic and the dead space was closed with 2-0 Vicryl, 3-0 Vicryl was used to reapproximate the skin in a subcuticular fashion.  Benzoin and Steri-Strips were applied.  The patient tolerated the procedure well.  Sponge, lap and needle counts  were correct x2 per the operating room staff.  TN/NUANCE  D:10/25/2018 T:10/25/2018 JOB:[id number]

## 2018-10-25 NOTE — Anesthesia Preprocedure Evaluation (Addendum)
Anesthesia Evaluation  Patient identified by MRN, date of birth, ID band Patient awake    Reviewed: Allergy & Precautions, NPO status , Patient's Chart, lab work & pertinent test results  Airway Mallampati: II  TM Distance: >3 FB Neck ROM: Full    Dental no notable dental hx.    Pulmonary former smoker,    Pulmonary exam normal breath sounds clear to auscultation       Cardiovascular negative cardio ROS Normal cardiovascular exam Rhythm:Regular Rate:Normal     Neuro/Psych negative neurological ROS  negative psych ROS   GI/Hepatic negative GI ROS, Neg liver ROS,   Endo/Other  Hypothyroidism   Renal/GU negative Renal ROS     Musculoskeletal negative musculoskeletal ROS (+)   Abdominal (+) + obese,   Peds  Hematology negative hematology ROS (+)   Anesthesia Other Findings Desires permanent sterilization  Reproductive/Obstetrics                             Anesthesia Physical Anesthesia Plan  ASA: III  Anesthesia Plan: Epidural   Post-op Pain Management:    Induction:   PONV Risk Score and Plan: 2 and Ondansetron, Dexamethasone and Treatment may vary due to age or medical condition  Airway Management Planned: Natural Airway, Nasal Cannula and Simple Face Mask  Additional Equipment:   Intra-op Plan:   Post-operative Plan:   Informed Consent: I have reviewed the patients History and Physical, chart, labs and discussed the procedure including the risks, benefits and alternatives for the proposed anesthesia with the patient or authorized representative who has indicated his/her understanding and acceptance.       Plan Discussed with: CRNA  Anesthesia Plan Comments:        Anesthesia Quick Evaluation

## 2018-10-25 NOTE — Brief Op Note (Signed)
10/25/2018  7:03 PM  PATIENT:  Estill Bamberg Anglemyer  38 y.o. female  PRE-OPERATIVE DIAGNOSIS:  desires permanent sterilization  POST-OPERATIVE DIAGNOSIS:  desires permanent sterilization   PROCEDURE:  Procedure(s): POST PARTUM TUBAL LIGATION (N/A)  SURGEON:  Surgeon(s) and Role:    * Bovard-Stuckert, Shaheem Pichon, MD - Primary  ASSISTANTS: Maida Sale RNFA   ANESTHESIA:   local and epidural  EBL:  5 mL IVF and uop per anesthesia  BLOOD ADMINISTERED:none  DRAINS: Urinary Catheter (Foley) to PACU  LOCAL MEDICATIONS USED:  MARCAINE     SPECIMEN:  Source of Specimen:  B tubal segments  DISPOSITION OF SPECIMEN:  PATHOLOGY  COUNTS:  YES  TOURNIQUET:  * No tourniquets in log *  DICTATION: .Other Dictation: Dictation Number [id number]  PLAN OF CARE: Admit to inpatient   PATIENT DISPOSITION:  PACU - hemodynamically stable.   Delay start of Pharmacological VTE agent (>24hrs) due to surgical blood loss or risk of bleeding: not applicable

## 2018-10-25 NOTE — Anesthesia Procedure Notes (Signed)
Epidural Patient location during procedure: OB Start time: 10/25/2018 11:25 AM End time: 10/25/2018 11:29 AM  Staffing Anesthesiologist: Lyn Hollingshead, MD  Preanesthetic Checklist Completed: patient identified, site marked, surgical consent, pre-op evaluation, timeout performed, IV checked, risks and benefits discussed and monitors and equipment checked  Epidural Patient position: sitting Prep: site prepped and draped and DuraPrep Patient monitoring: continuous pulse ox and blood pressure Approach: midline Location: L3-L4 Injection technique: LOR air  Needle:  Needle type: Tuohy  Needle gauge: 17 G Needle length: 9 cm and 9 Needle insertion depth: 6 cm Catheter type: closed end flexible Catheter size: 19 Gauge Catheter at skin depth: 11 cm Test dose: negative and Other  Assessment Sensory level: T9 Events: blood not aspirated, injection not painful, no injection resistance, negative IV test and no paresthesia  Additional Notes Reason for block:procedure for pain

## 2018-10-25 NOTE — Transfer of Care (Signed)
Immediate Anesthesia Transfer of Care Note  Patient: Jill Murillo  Procedure(s) Performed: POST PARTUM TUBAL LIGATION (N/A )  Patient Location: PACU  Anesthesia Type:Epidural  Level of Consciousness: awake, alert  and oriented  Airway & Oxygen Therapy: Patient Spontanous Breathing  Post-op Assessment: Report given to RN and Post -op Vital signs reviewed and stable  Post vital signs: Reviewed and stable  Last Vitals:  Vitals Value Taken Time  BP 113/58 10/25/2018  6:56 PM  Temp    Pulse 66 10/25/2018  6:59 PM  Resp 20 10/25/2018  6:59 PM  SpO2 100 % 10/25/2018  6:59 PM  Vitals shown include unvalidated device data.  Last Pain:  Vitals:   10/25/18 1430  TempSrc: Oral         Complications: No apparent anesthesia complications/

## 2018-10-25 NOTE — Progress Notes (Signed)
Patient ID: Jill Murillo, female   DOB: 03/18/1981, 38 y.o.   MRN: [id number]   Comfortable w epidural  AFVSS gen NAD FHTs 125-130's, mod var, + accels, category 1 toco q 2-64min  AROM for light meconium, d/w pt, no comp/diff  5.6/70/-2  Continue current mgmt - IOL

## 2018-10-25 NOTE — Anesthesia Pain Management Evaluation Note (Signed)
  CRNA Pain Management Visit Note  Patient: Jill Murillo, 38 y.o., female  "Hello I am a member of the anesthesia team at [facility]. We have an anesthesia team available at all times to provide care throughout the hospital, including epidural management and anesthesia for C-section. I don't know your plan for the delivery whether it a natural birth, water birth, IV sedation, nitrous supplementation, doula or epidural, but we want to meet your pain goals."   1.Was your pain managed to your expectations on prior hospitalizations?   Yes   2.What is your expectation for pain management during this hospitalization?     Epidural  3.How can we help you reach that goal?   Record the patient's initial score and the patient's pain goal.   Pain: 2  Pain Goal: 5 The [facility] wants you to be able to say your pain was always managed very well.  Jabier Mutton 10/25/2018

## 2018-10-25 NOTE — Progress Notes (Signed)
Patient ID: Jill Murillo, female   DOB: July 14, 1981, 38 y.o.   MRN: [id number]   Reviewed H&P, no changes Will AROM after #2 PCN  FHTs 130-140, mod var + accels toco q 4-56min  Cont close monitoring/pitocin - IOL

## 2018-10-26 LAB — CBC
HCT: 34.3 % — ABNORMAL LOW (ref 36.0–46.0)
Hemoglobin: 11 g/dL — ABNORMAL LOW (ref 12.0–15.0)
MCH: 27.6 pg (ref 26.0–34.0)
MCHC: 32.1 g/dL (ref 30.0–36.0)
MCV: 86 fL (ref 80.0–100.0)
Platelets: 227 10*3/uL (ref 150–400)
RBC: 3.99 MIL/uL (ref 3.87–5.11)
RDW: 15.1 % (ref 11.5–15.5)
WBC: 13.5 10*3/uL — ABNORMAL HIGH (ref 4.0–10.5)
nRBC: 0 % (ref 0.0–0.2)

## 2018-10-26 NOTE — Progress Notes (Signed)
Patient ID: Jill Murillo, female   DOB: [birth year], 38 y.o.   MRN: [id number] Pt doing well. Ambulating with no issues. Voiding well. Reports sore at epidural site and tender over umbilical incision as expected. No fever, chills, CP or SOB. Lochia moderate. Bonding well with baby - breastfeeding [facility]).  VS - 131-142/89-97 ABD - FF and below umbilicus; dressing c/d/i; some distension EXT - no homans  13.5>11<227  A/P: PPD#1/POD#1 s/p svd and BTL - stable         Abdominal binder ordered         Alternate ice/heat to epidural site         Routine pp/post op care         Monitor BP; if indicated treat with meds

## 2018-10-26 NOTE — Lactation Note (Signed)
This note was copied from a baby's chart. Lactation Consultation Note  Patient Name: Girl Blessed Girdner [id number]'V Date: 10/26/2018 Reason for consult: Initial assessment;Maternal endocrine disorder;Term Type of Endocrine Disorder?: Thyroid  Visited with P2 Mom of term baby at 48 hrs old.  Mom on Synthroid for hypothyroidism.  Mom's first baby (38 yrs old) wouldn't latch, tried nipple shield, lost weight, pumped, supplemented and gave up. This baby latched on in PACU twice.  Mom is thrilled. Baby swaddled in GMOB's arms.  Offered to assist/assess with positioning and latching.  Reviewed breast massage and hand expression, drop of colostrum noted. Assisted Mom with cross cradle hold.  Baby rooting around for her hand.  Assisted Mom to support and sandwich her breast in a U hold.  (Nipple tip noted to be a little abraded and reddened on tip)  Baby opens wide, and assisted with baby latching deeply.  Showed FOB how to gently tug on baby's chin to uncurl lower lip.   Mom felt that baby was latched better, but still some tenderness from abrasion on nipple.   Swallows identified for Mom.  Taught Mom to use alternate breast compression during feeding.   When baby came off, slight pinching noted, but Mom felt that latch was more comfortable.   Coconut oil given with instructions to hand express colostrum first onto nipple. Encouraged keeping baby STS as much as possible, feeding baby with any cue.  Mom to ask for assistance prn. Lactation brochure given with explanation on IP and OP lactation support available to her.    Maternal Data Formula Feeding for Exclusion: No Has patient been taught Hand Expression?: Yes Does the patient have breastfeeding experience prior to this delivery?: Yes  Feeding Feeding Type: Breast Fed  LATCH Score Latch: Grasps breast easily, tongue down, lips flanged, rhythmical sucking.  Audible Swallowing: Spontaneous and intermittent  Type of Nipple: Everted at rest and  after stimulation  Comfort (Breast/Nipple): Filling, red/small blisters or bruises, mild/mod discomfort  Hold (Positioning): Assistance needed to correctly position infant at breast and maintain latch.  LATCH Score: 8  Interventions Interventions: Breast feeding basics reviewed;Assisted with latch;Skin to skin;Breast massage;Hand express;Support pillows;Adjust position;Breast compression;Position options;Expressed milk;Coconut oil  Lactation Tools Discussed/Used Tools: Coconut oil WIC Program: No   Consult Status Consult Status: Follow-up Date: 10/27/18 Follow-up type: In-patient    Broadus John 10/26/2018, 11:52 AM

## 2018-10-26 NOTE — Anesthesia Postprocedure Evaluation (Signed)
Anesthesia Post Note  Patient: Jill Murillo  Procedure(s) Performed: POST PARTUM TUBAL LIGATION (N/A )     Patient location during evaluation: Mother Baby Anesthesia Type: Epidural Level of consciousness: awake, awake and alert and oriented Pain management: pain level controlled Vital Signs Assessment: post-procedure vital signs reviewed and stable Respiratory status: spontaneous breathing and respiratory function stable Cardiovascular status: blood pressure returned to baseline and stable Postop Assessment: no headache, no backache, epidural receding, patient able to bend at knees, no apparent nausea or vomiting, adequate PO intake and able to ambulate Anesthetic complications: no    Last Vitals:  Vitals:   10/26/18 0200 10/26/18 0543  BP: 140/89 (!) 142/92  Pulse: (!) 54 68  Resp: 16 16  Temp: 36.9 C 36.9 C  SpO2: 98% 97%    Last Pain:  Vitals:   10/26/18 0816  TempSrc:   PainSc: 3    Pain Goal:                @ANFLOW60MIN (12500)  )Bufford Spikes

## 2018-10-26 NOTE — Anesthesia Postprocedure Evaluation (Signed)
Anesthesia Post Note  Patient: Jill Murillo  Procedure(s) Performed: AN AD HOC LABOR EPIDURAL     Patient location during evaluation: Mother Baby Anesthesia Type: Epidural Level of consciousness: awake Pain management: pain level controlled Vital Signs Assessment: post-procedure vital signs reviewed and stable Respiratory status: spontaneous breathing Cardiovascular status: stable Postop Assessment: no headache, no backache, epidural receding, patient able to bend at knees and no apparent nausea or vomiting Anesthetic complications: no    Last Vitals:  Vitals:   10/26/18 0200 10/26/18 0543  BP: 140/89 (!) 142/92  Pulse: (!) 54 68  Resp: 16 16  Temp: 36.9 C 36.9 C  SpO2: 98% 97%    Last Pain:  Vitals:   10/26/18 0715  TempSrc:   PainSc: 4    Pain Goal:                @ANFLOW60MIN (12500)  )Huston Foley

## 2018-10-26 NOTE — Progress Notes (Signed)
Patient ID: Jill Murillo, female   DOB: [birth year], 38 y.o.   MRN: [id number] Pt resting comfortably with no complaints  Plan: continue current care

## 2018-10-26 NOTE — Anesthesia Preprocedure Evaluation (Signed)
Anesthesia Evaluation  Patient identified by MRN, date of birth, ID band Patient awake    Reviewed: Allergy & Precautions, H&P , NPO status , Patient's Chart, lab work & pertinent test results  Airway Mallampati: II  TM Distance: >3 FB Neck ROM: full    Dental no notable dental hx. (+) Teeth Intact   Pulmonary neg pulmonary ROS, former smoker,    Pulmonary exam normal breath sounds clear to auscultation       Cardiovascular negative cardio ROS Normal cardiovascular exam Rhythm:regular Rate:Normal     Neuro/Psych negative neurological ROS  negative psych ROS   GI/Hepatic negative GI ROS, Neg liver ROS,   Endo/Other  Hypothyroidism Morbid obesity  Renal/GU negative Renal ROS     Musculoskeletal   Abdominal (+) + obese,   Peds  Hematology negative hematology ROS (+)   Anesthesia Other Findings   Reproductive/Obstetrics (+) Pregnancy                             Anesthesia Physical Anesthesia Plan  ASA: III  Anesthesia Plan: Epidural   Post-op Pain Management:    Induction:   PONV Risk Score and Plan:   Airway Management Planned:   Additional Equipment:   Intra-op Plan:   Post-operative Plan:   Informed Consent: I have reviewed the patients History and Physical, chart, labs and discussed the procedure including the risks, benefits and alternatives for the proposed anesthesia with the patient or authorized representative who has indicated his/her understanding and acceptance.       Plan Discussed with:   Anesthesia Plan Comments:         Anesthesia Quick Evaluation

## 2018-10-27 ENCOUNTER — Encounter (HOSPITAL_COMMUNITY): Payer: Self-pay | Admitting: Obstetrics and Gynecology

## 2018-10-27 ENCOUNTER — Inpatient Hospital Stay (HOSPITAL_COMMUNITY): Admission: AD | Admit: 2018-10-27 | Payer: 59 | Source: Ambulatory Visit | Admitting: Obstetrics and Gynecology

## 2018-10-27 MED ORDER — IBUPROFEN 600 MG PO TABS: 600.0000 mg | ORAL_TABLET | Freq: Four times a day (QID) | ORAL | 0 refills | Status: DC

## 2018-10-27 MED ORDER — ACETAMINOPHEN 325 MG PO TABS: 650.0000 mg | ORAL_TABLET | ORAL | 0 refills | Status: DC | PRN

## 2018-10-27 MED ORDER — OXYCODONE HCL 5 MG PO TABS: 5.0000 mg | ORAL_TABLET | ORAL | 0 refills | Status: DC | PRN

## 2018-10-27 NOTE — Progress Notes (Signed)
Post Partum Day 2 NSVD/ppBTL Subjective: no complaints, up ad lib and tolerating PO  Objective: Blood pressure 124/80, pulse 71, temperature 97.7 F (36.5 C), temperature source Oral, resp. rate 16, height 5\' 4"  (1.626 m), weight 113.2 kg, last menstrual period 01/20/2018, SpO2 98 %, unknown if currently breastfeeding.  Physical Exam:  General: alert and cooperative Lochia: appropriate Uterine Fundus: firm Incision: clean, dry, intact   Recent Labs    10/25/18 0809 10/26/18 0544  HGB 12.0 11.0*  HCT 37.7 34.3*    Assessment/Plan: Discharge home  F/u 2 weeks for incision check   LOS: 2 days   Logan Bores 10/27/2018, 9:40 AM

## 2018-10-27 NOTE — Discharge Summary (Signed)
OB Discharge Summary     Patient Name: Jill Murillo DOB: 1981/05/07 MRN: [id number]  Date of admission: 10/25/2018 Delivering MD: Janyth Contes   Date of discharge: 10/27/2018  Admitting diagnosis: 39wks induction Intrauterine pregnancy: [redacted]w[redacted]d     Secondary diagnosis:  Principal Problem:   SVD (spontaneous vaginal delivery) Active Problems:   Normal pregnancy in multigravida in third trimester   S/P tubal ligation  Additional problems: none     Discharge diagnosis: Term Pregnancy Delivered                                                                                                Post partum procedures:postpartum tubal ligation  Augmentation: AROM and Pitocin  Complications: None  Hospital course:  Induction of Labor With Vaginal Delivery   38 y.o. yo [id number] at [redacted]w[redacted]d was admitted to the hospital 10/25/2018 for induction of labor.  Indication for induction: Favorable cervix at term.  Patient had an uncomplicated labor course as follows: Membrane Rupture Time/Date: 1:15 PM ,10/25/2018   Intrapartum Procedures: Episiotomy: None [1]                                         Lacerations:  2nd degree [3];Perineal [11]  Patient had delivery of a Viable infant.  Information for the patient's newborn:  Lynnetta, Tom Girl Asees [[id number]]  Delivery Method: Vaginal, Spontaneous(Filed from Delivery Summary)   10/25/2018  Details of delivery can be found in separate delivery note.  Patient had a routine postpartum course. Patient is discharged home 10/27/18.  Physical exam  Vitals:   10/26/18 1300 10/26/18 1732 10/26/18 2153 10/27/18 0548  BP: (!) 144/89 140/77 (!) 147/89 124/80  Pulse: 61 61 67 71  Resp: 20  18 16   Temp: 98.6 F (37 C)  98.3 F (36.8 C) 97.7 F (36.5 C)  TempSrc: Oral  Oral Oral  SpO2: 98%  98% 98%  Weight:      Height:       General: alert and cooperative Lochia: appropriate Uterine Fundus: firm Incision: Dressing is clean, dry, and  intact  Labs: Lab Results  Component Value Date   WBC 13.5 (H) 10/26/2018   HGB 11.0 (L) 10/26/2018   HCT 34.3 (L) 10/26/2018   MCV 86.0 10/26/2018   PLT 227 10/26/2018   CMP Latest Ref Rng & Units 06/04/2014  Glucose 70 - 99 mg/dL 72  BUN 6 - 23 mg/dL 9  Creatinine 0.50 - 1.10 mg/dL 0.57  Sodium 137 - 147 mEq/L 136(L)  Potassium 3.7 - 5.3 mEq/L 4.1  Chloride 96 - 112 mEq/L 103  CO2 19 - 32 mEq/L 20  Calcium 8.4 - 10.5 mg/dL 8.9  Total Protein 6.0 - 8.3 g/dL 6.2  Total Bilirubin 0.3 - 1.2 mg/dL 0.3  Alkaline Phos 39 - 117 U/L 129(H)  AST 0 - 37 U/L 13  ALT 0 - 35 U/L 10    Discharge instruction: per After Visit Summary and "Baby and Me Booklet".  After visit meds:  Allergies as of 10/27/2018   No Known Allergies     Medication List    TAKE these medications   acetaminophen 325 MG tablet Commonly known as:  TYLENOL Take 2 tablets (650 mg total) by mouth every 4 (four) hours as needed (for pain scale < 4).   ibuprofen 600 MG tablet Commonly known as:  ADVIL,MOTRIN Take 1 tablet (600 mg total) by mouth every 6 (six) hours.   levothyroxine 150 MCG tablet Commonly known as:  SYNTHROID, LEVOTHROID Take 150 mcg by mouth daily before breakfast.   oxyCODONE 5 MG immediate release tablet Commonly known as:  Oxy IR/ROXICODONE Take 1 tablet (5 mg total) by mouth every 4 (four) hours as needed (pain scale 4-7).   prenatal multivitamin Tabs tablet Take 1 tablet by mouth daily at 12 noon.       Diet: routine diet  Activity: Advance as tolerated. Pelvic rest for 6 weeks.   Outpatient follow up:2 weeks Follow up Appt:No future appointments. Follow up Visit:No follow-ups on file.  Postpartum contraception: Tubal Ligation  Newborn Data: Live born female  Birth Weight: 7 lb 3.7 oz (3280 g) APGAR: 8, 9  Newborn Delivery   Birth date/time:  10/25/2018 15:40:00 Delivery type:  Vaginal, Spontaneous     Baby Feeding: Breast Disposition:home with  mother   10/27/2018 Logan Bores, MD

## 2018-10-27 NOTE — Lactation Note (Signed)
This note was copied from a baby's chart. Lactation Consultation Note  Patient Name: Jill Murillo AXKPV'V Date: 10/27/2018 Reason for consult: Follow-up assessment;Nipple pain/trauma;Infant weight loss  Visited with P2 Mom of term baby at 45 hrs old, on day of discharge.  Baby at 7.9% weight loss, output 3 voids and 1 stool last 24 hrs.    Mom had baby in cradle hold without any pillow support, with baby swaddled.  Latch to breast looked good, but talked to Mom about STS and support under baby.  Mom's nipples are sore, and some bruising noted. Nipple noted to be pinched when baby came off.  RN brought in Comfort Gels to use alternately with coconut oil.   Repositioned Mom's hands for cross cradle to sandwich and support breast.  Hand expressed what looks like transitional milk, and flow was plentiful.  Baby latches well, but does tuck lower lip in, and reminded of the chin tug to uncurl lip.  Multiple swallowing identified, and Mom denied pain with latch.  Nipple more rounded when baby taken off to assess.  Encouraged STS and feeding baby at least every 3 hrs (due to weight loss).   Engorgement prevention and treatment reviewed.  Mom interested in OP lactation follow-up.  Message sent for this to clinic.  Mom aware of OP lactation support available to her.  Encouraged to call prn.    Feeding Feeding Type: Breast Fed  LATCH Score Latch: Grasps breast easily, tongue down, lips flanged, rhythmical sucking.  Audible Swallowing: Spontaneous and intermittent  Type of Nipple: Everted at rest and after stimulation  Comfort (Breast/Nipple): Filling, red/small blisters or bruises, mild/mod discomfort  Hold (Positioning): Assistance needed to correctly position infant at breast and maintain latch.  LATCH Score: 8  Interventions Interventions: Breast feeding basics reviewed;Assisted with latch;Skin to skin;Breast massage;Hand express;Breast compression;Adjust position;Support  pillows;Position options;Coconut oil;Comfort gels  Lactation Tools Discussed/Used Tools: Comfort gels;Coconut oil   Consult Status Consult Status: Complete Date: 10/27/18 Follow-up type: Out-patient referral sent to [facility] 10/27/2018, 9:32 AM

## 2019-04-19 ENCOUNTER — Other Ambulatory Visit: Payer: Self-pay | Admitting: Family Medicine

## 2019-04-19 DIAGNOSIS — R1033 Periumbilical pain: Secondary | ICD-10-CM

## 2019-04-27 ENCOUNTER — Ambulatory Visit
Admission: RE | Admit: 2019-04-27 | Discharge: 2019-04-27 | Disposition: A | Discharge status: Home / Self Care | Payer: No Typology Code available for payment source | Source: Ambulatory Visit | Attending: Family Medicine | Admitting: Family Medicine

## 2019-04-27 DIAGNOSIS — R1033 Periumbilical pain: Secondary | ICD-10-CM

## 2020-10-12 DIAGNOSIS — N920 Excessive and frequent menstruation with regular cycle: Secondary | ICD-10-CM

## 2020-10-12 HISTORY — DX: Excessive and frequent menstruation with regular cycle: N92.0

## 2021-03-13 ENCOUNTER — Other Ambulatory Visit: Payer: Self-pay | Admitting: Obstetrics and Gynecology

## 2021-03-13 DIAGNOSIS — N63 Unspecified lump in unspecified breast: Secondary | ICD-10-CM

## 2021-03-24 ENCOUNTER — Other Ambulatory Visit: Payer: Self-pay | Admitting: Obstetrics and Gynecology

## 2021-03-24 DIAGNOSIS — R928 Other abnormal and inconclusive findings on diagnostic imaging of breast: Secondary | ICD-10-CM

## 2021-03-27 ENCOUNTER — Ambulatory Visit
Admission: RE | Admit: 2021-03-27 | Discharge: 2021-03-27 | Disposition: A | Discharge status: Home / Self Care | Payer: No Typology Code available for payment source | Source: Ambulatory Visit | Attending: Obstetrics and Gynecology | Admitting: Obstetrics and Gynecology

## 2021-03-27 ENCOUNTER — Other Ambulatory Visit: Payer: Self-pay | Admitting: Obstetrics and Gynecology

## 2021-03-27 ENCOUNTER — Other Ambulatory Visit: Payer: Self-pay

## 2021-03-27 DIAGNOSIS — R928 Other abnormal and inconclusive findings on diagnostic imaging of breast: Secondary | ICD-10-CM

## 2021-04-03 ENCOUNTER — Ambulatory Visit
Admission: RE | Admit: 2021-04-03 | Discharge: 2021-04-03 | Disposition: A | Discharge status: Home / Self Care | Payer: No Typology Code available for payment source | Source: Ambulatory Visit | Attending: Obstetrics and Gynecology | Admitting: Obstetrics and Gynecology

## 2021-04-03 ENCOUNTER — Other Ambulatory Visit: Payer: Self-pay

## 2021-04-03 DIAGNOSIS — R928 Other abnormal and inconclusive findings on diagnostic imaging of breast: Secondary | ICD-10-CM

## 2021-04-03 HISTORY — PX: BREAST BIOPSY: SHX20

## 2021-12-09 NOTE — Progress Notes (Signed)
Surgical clearance received 12/09/21. Copy placed on chart.

## 2021-12-18 ENCOUNTER — Encounter (HOSPITAL_BASED_OUTPATIENT_CLINIC_OR_DEPARTMENT_OTHER): Payer: Self-pay | Admitting: Obstetrics and Gynecology

## 2021-12-18 ENCOUNTER — Other Ambulatory Visit: Payer: Self-pay

## 2021-12-18 DIAGNOSIS — Z01818 Encounter for other preprocedural examination: Secondary | ICD-10-CM | POA: Diagnosis present

## 2021-12-18 DIAGNOSIS — N920 Excessive and frequent menstruation with regular cycle: Secondary | ICD-10-CM | POA: Diagnosis not present

## 2021-12-18 NOTE — Progress Notes (Signed)
?PLEASE WEAR A MASK OUT IN PUBLIC AND SOCIAL DISTANCE AND [place] YOUR HANDS FREQUENTLY. PLEASE ASK ALL YOUR CLOSE HOUSEHOLD CONTACT TO WEAR MASK OUT IN PUBLIC AND SOCIAL DISTANCE AND [place] HANDS FREQUENTLY ALSO. ? ? ? ? ? Your procedure is scheduled on Thursday, 12/25/21. ? Report to [facility] AT  5:30 am. ? ? Call this number if you have problems the morning of surgery  [phone]. ? ? [place].  WE ARE LOCATED IN [facility]. ? ?PLEASE BRING YOUR INSURANCE CARD AND PHOTO ID DAY OF SURGERY. ? ?ONLY ONE PERSON ALLOWED IN FACILITY WAITING AREA. ?                                   ? REMEMBER: ? DO NOT EAT FOOD, CANDY GUM OR MINTS  AFTER MIDNIGHT THE NIGHT BEFORE YOUR SURGERY . YOU MAY HAVE CLEAR LIQUIDS FROM MIDNIGHT THE NIGHT BEFORE YOUR SURGERY UNTIL  4:30 am. NO CLEAR LIQUIDS AFTER   4:30 am DAY OF SURGERY. ? ? YOU MAY  BRUSH YOUR TEETH MORNING OF SURGERY AND RINSE YOUR MOUTH OUT, NO CHEWING GUM CANDY OR MINTS. ? ? ? ?CLEAR LIQUID DIET ? ? ?Foods Allowed                                                                     Foods Excluded ? ?Coffee and tea, regular and decaf                             liquids that you cannot  ?Plain Jell-O any favor except red or purple                                           see through such as: ?Fruit ices (not with fruit pulp)                                     milk, soups, orange juice  ?Iced Popsicles                                    All solid food ?Carbonated beverages, regular and diet                                    ?Cranberry, grape and apple juices ?Sports drinks like Gatorade ? ?Sample Menu ?Breakfast                                Lunch  Supper ?Cranberry juice                                           ?Jell-O                                     Grape juice                           Apple juice ?Coffee or tea                        Jell-O                                       Popsicle ?                                               Coffee or tea                        Coffee or tea ? ?_____________________________________________________________________ ?  ? ? TAKE THESE MEDICATIONS MORNING OF SURGERY WITH A SIP OF WATER:   NONE ? ?ONE VISITOR IS ALLOWED IN WAITING ROOM ONLY DAY OF SURGERY.  YOU MAY HAVE ANOTHER PERSON SWITCH OUT WITH THE  1  VISITOR IN THE WAITING ROOM DAY OF SURGERY AND A MASK MUST BE WORN IN THE WAITING ROOM.  ? ? 2 VISITORS  MAY VISIT IN THE EXTENDED RECOVERY ROOM UNTIL 800 PM ONLY 1 VISITOR AGE 65 AND OVER MAY SPEND THE NIGHT AND MUST BE IN EXTENDED RECOVERY ROOM NO LATER THAN 800 PM .  ? ? UP TO 2 CHILDREN AGE 70 TO 15 MAY ALSO VISIT IN EXTENDED RECOV ?ERY ROOM ONLY UNTIL 800 PM AND MUST LEAVE BY 800 PM.  ? ?ALL PERSONS VISITING IN EXTENDED RECOVERY ROOM MUST WEAR A MASK. ?                                   ?DO NOT WEAR JEWERLY, MAKE UP. ?DO NOT WEAR LOTIONS, POWDERS, PERFUMES OR NAIL POLISH ON YOUR FINGERNAILS. TOENAIL POLISH IS OK TO WEAR. ?DO NOT SHAVE FOR 48 HOURS PRIOR TO DAY OF SURGERY. ?MEN MAY SHAVE FACE AND NECK. ?CONTACTS, GLASSES, OR DENTURES MAY NOT BE WORN TO SURGERY. ?                                   ?[facility] IS NOT RESPONSIBLE  FOR ANY BELONGINGS.                                  ?                                  . ?  [facility] - Preparing for Surgery ?Before surgery, you can play an important role.  Because skin is not sterile, your skin needs to be as free of germs as possible.  You can reduce the number of germs on your skin by washing with CHG (chlorahexidine gluconate) soap before surgery.  CHG is an antiseptic cleaner which kills germs and bonds with the skin to continue killing germs even after washing. ?Please DO NOT use if you have an allergy to CHG or antibacterial soaps.  If your skin becomes reddened/irritated stop using the CHG and inform your nurse when you arrive at Short Stay. ?Do not shave (including legs and  underarms) for at least 48 hours prior to the first CHG shower.  You may shave your face/neck. ?Please follow these instructions carefully: ? 1.  Shower with CHG Soap the night before surgery and the  morning of Surgery. ? 2.  If you choose to wash your hair, wash your hair first as usual with your  normal  shampoo. ? 3.  After you shampoo, rinse your hair and body thoroughly to remove the  shampoo.                            ?4.  Use CHG as you would any other liquid soap.  You can apply chg directly  to the skin and wash  ?                    Gently with a scrungie or clean washcloth. ? 5.  Apply the CHG Soap to your body ONLY FROM THE NECK DOWN.   Do not use on face/ open      ?                     Wound or open sores. Avoid contact with eyes, ears mouth and genitals (private parts).  ?                     Production manager,  Genitals (private parts) with your normal soap. ?            6.  Wash thoroughly, paying special attention to the area where your surgery  will be performed. ? 7.  Thoroughly rinse your body with warm water from the neck down. ? 8.  DO NOT shower/wash with your normal soap after using and rinsing off  the CHG Soap. ?               9.  Pat yourself dry with a clean towel. ?           10.  Wear clean pajamas. ?           11.  Place clean sheets on your bed the night of your first shower and do not  sleep with pets. ?Day of Surgery : ?Do not apply any lotions/deodorants the morning of surgery.  Please wear clean clothes to the hospital/surgery center. ? ?IF YOU HAVE ANY SKIN IRRITATION OR PROBLEMS WITH THE SURGICAL SOAP, PLEASE GET A BAR OF GOLD DIAL SOAP AND SHOWER THE NIGHT BEFORE YOUR SURGERY AND THE MORNING OF YOUR SURGERY. PLEASE LET THE NURSE KNOW MORNING OF YOUR SURGERY IF YOU HAD ANY PROBLEMS WITH THE SURGICAL SOAP. ? ? ?________________________________________________________________________                  ?                                    ?  QUESTIONS CALL Lavanda Nevels PRE OP NURSE PHONE  [phone].                                    ?

## 2021-12-18 NOTE — Progress Notes (Signed)
Spoke w/ via phone for pre-op interview---Jill Murillo ?Lab needs dos---- urine pregnancy POCT              ?Lab results------12/22/21 lab appt for CBC, CMP, type & screen ?COVID test -----patient states asymptomatic no test needed ?Arrive at -------0530 on Thursday, 12/25/21 ?NPO after MN NO Solid Food.  Clear liquids from MN until---0430 ?Med rec completed ?Medications to take morning of surgery -----none ?Diabetic medication -----n/a ?Patient instructed no nail polish to be worn day of surgery ?Patient instructed to bring photo id and insurance card day of surgery ?Patient aware to have Driver (ride ) / caregiver    for 24 hours after surgery - husband, Jill Murillo ?Patient Special Instructions -----Extended recovery instructions given. ?Pre-Op special Istructions -----none ?Patient verbalized understanding of instructions that were given at this phone interview. ?Patient denies shortness of breath, chest pain, fever, cough at this phone interview.  ?

## 2021-12-21 NOTE — H&P (Signed)
Jill Murillo is an 41 y.o. female [phone] with menorrhagia for LAVH, cysto.  D/W pt r/b/a of surgery also process and expectations.  Pt also has elevated BP, hypothyroid and elevated BMI.   ? ?Pertinent Gynecological History: ?[id number] 2 SAB, 2 SVD ?+ abn pap, last WNL HR HPV neg ?No STDs ? ?Menstrual History: ? ?Patient's last menstrual period was 11/27/2021 (approximate). ?  ? ?Past Medical History:  ?Diagnosis Date  ? GERD (gastroesophageal reflux disease)   ? Hypothyroid   ? Menorrhagia 2022  ? Normal pregnancy in multigravida in third trimester 10/24/2018  ? S/P tubal ligation 10/25/2018  ? SVD (spontaneous vaginal delivery) 10/25/2018  ? Umbilical hernia   ?gout ? ?Past Surgical History:  ?Procedure Laterality Date  ? BREAST BIOPSY Right 04/03/2021  ? needle core biopsy - fibroadenoma, no evidence of malignancy  ? TUBAL LIGATION N/A 10/25/2018  ? Procedure: POST PARTUM TUBAL LIGATION;  Surgeon: Janyth Contes, MD;  Location: [facility];  Service: Gynecology;  Laterality: N/A;  ? [facility] EXTRACTION  2000  ?B salpingectomy ? ?Family History  ?Problem Relation Age of Onset  ? Hypertension Father   ?Breast CA, cervical CA, HTN, colon CA, DM, uterine CA, DM, bone CA ? ?Social History:  reports that she quit smoking about 10 years ago. Her smoking use included cigarettes. She has never used smokeless tobacco. She reports current alcohol use. She reports that she does not use drugs.married, works at [employer] ? ?Allergies: No Known Allergies ? ?Meds: Vitamin D, levothyroxine, prilosec ? ?Review of Systems  ?Constitutional: Negative.   ?HENT: Negative.    ?Eyes: Negative.   ?Respiratory: Negative.    ?Cardiovascular: Negative.   ?Gastrointestinal: Negative.   ?Genitourinary: Negative.   ?Musculoskeletal: Negative.   ?Skin: Negative.   ?Neurological: Negative.   ?Psychiatric/Behavioral: Negative.    ? ?Height 5' 4.25" (1.632 m), weight 115.7 kg, last menstrual period 11/27/2021, unknown if currently  breastfeeding. ?Physical Exam ?Constitutional:   ?   Appearance: Normal appearance.  ?HENT:  ?   Head: Normocephalic and atraumatic.  ?Cardiovascular:  ?   Rate and Rhythm: Normal rate and regular rhythm.  ?Pulmonary:  ?   Effort: Pulmonary effort is normal.  ?   Breath sounds: Normal breath sounds.  ?Abdominal:  ?   General: Bowel sounds are normal.  ?   Palpations: Abdomen is soft.  ?Musculoskeletal:     ?   General: Normal range of motion.  ?   Cervical back: Normal range of motion and neck supple.  ?Skin: ?   General: Skin is warm and dry.  ?Neurological:  ?   General: No focal deficit present.  ?   Mental Status: She is alert and oriented to person, place, and time.  ?Psychiatric:     ?   Mood and Affect: Mood normal.     ?   Behavior: Behavior normal.  ? ?[place] - PCOS ovaries, anteverted uterus, synchaiae, no polyp ? ?Assessment/Plan: ?49SW [id number] with menorrhagia for LAVH, cystoscopy ?D/w pt r/b/a, process and expectations ?Ancef for prophyaxis ? ?Jill Murillo ?12/21/2021, 1:13 PM ? ?

## 2021-12-22 ENCOUNTER — Other Ambulatory Visit: Payer: Self-pay

## 2021-12-22 ENCOUNTER — Encounter (HOSPITAL_COMMUNITY)
Admission: RE | Admit: 2021-12-22 | Discharge: 2021-12-22 | Disposition: A | Discharge status: Home / Self Care | Payer: No Typology Code available for payment source | Source: Ambulatory Visit | Attending: Obstetrics and Gynecology | Admitting: Obstetrics and Gynecology

## 2021-12-22 DIAGNOSIS — Z01818 Encounter for other preprocedural examination: Secondary | ICD-10-CM | POA: Diagnosis not present

## 2021-12-22 DIAGNOSIS — N920 Excessive and frequent menstruation with regular cycle: Secondary | ICD-10-CM | POA: Diagnosis not present

## 2021-12-22 LAB — COMPREHENSIVE METABOLIC PANEL
ALT: 34 U/L (ref 0–44)
AST: 26 U/L (ref 15–41)
Albumin: 4.2 g/dL (ref 3.5–5.0)
Alkaline Phosphatase: 45 U/L (ref 38–126)
Anion gap: 9 (ref 5–15)
BUN: 11 mg/dL (ref 6–20)
CO2: 23 mmol/L (ref 22–32)
Calcium: 9.6 mg/dL (ref 8.9–10.3)
Chloride: 105 mmol/L (ref 98–111)
Creatinine, Ser: 0.67 mg/dL (ref 0.44–1.00)
GFR, Estimated: >60 mL/min (ref >60)
Glucose, Bld: 102 mg/dL — ABNORMAL HIGH (ref 70–99)
Potassium: 3.8 mmol/L (ref 3.5–5.1)
Sodium: 137 mmol/L (ref 135–145)
Total Bilirubin: 0.4 mg/dL (ref 0.3–1.2)
Total Protein: 7.5 g/dL (ref 6.5–8.1)

## 2021-12-22 LAB — CBC
HCT: 40.9 % (ref 36.0–46.0)
Hemoglobin: 13.5 g/dL (ref 12.0–15.0)
MCH: 28.6 pg (ref 26.0–34.0)
MCHC: 33 g/dL (ref 30.0–36.0)
MCV: 86.7 fL (ref 80.0–100.0)
Platelets: 250 10*3/uL (ref 150–400)
RBC: 4.72 MIL/uL (ref 3.87–5.11)
RDW: 13.2 % (ref 11.5–15.5)
WBC: 6.2 10*3/uL (ref 4.0–10.5)
nRBC: 0 % (ref 0.0–0.2)

## 2021-12-22 NOTE — Progress Notes (Signed)
Patient came in for pre-op lab appt. Her BP was 189/119. An EKG was done that showed NSR. Her blood pressure was taken again - 198/111. ?Patient denied any dizziness, light-headedness or headache. I called and spoke with Tiffany at Dr. Roe Rutherford office and let her know that the patient would need to see her PCP and get medical clearance before surgery. Tiffany stated that she would call the patient and let them know. ?

## 2021-12-24 NOTE — Anesthesia Preprocedure Evaluation (Addendum)
Anesthesia Evaluation  ?Patient identified by MRN, date of birth, ID band ?Patient awake ? ? ? ?Reviewed: ?Allergy & Precautions, NPO status , Patient's Chart, lab work & pertinent test results ? ?Airway ?Mallampati: II ? ?TM Distance: >3 FB ?Neck ROM: Full ? ? ? Dental ?no notable dental hx. ?(+) Teeth Intact, Dental Advisory Given ?  ?Pulmonary ?neg pulmonary ROS, former smoker,  ?  ?Pulmonary exam normal ?breath sounds clear to auscultation ? ? ? ? ? ? Cardiovascular ?hypertension, Pt. on medications ?Normal cardiovascular exam ?Rhythm:Regular Rate:Normal ? ? ?  ?Neuro/Psych ?  ? GI/Hepatic ?Neg liver ROS, GERD  ,  ?Endo/Other  ?Hypothyroidism Morbid obesity (BMI 44.11) ? Renal/GU ?Lab Results ?     Component                Value               Date                 ?     CREATININE               0.67                12/22/2021            ?     K                        3.8                 12/22/2021           ?      ? ?  ?Musculoskeletal ?negative musculoskeletal ROS ?(+)  ? Abdominal ?  ?Peds ? Hematology ?Lab Results ?     Component                Value               Date                 ?     WBC                      6.2                 12/22/2021           ?     HGB                      13.5                12/22/2021           ?     HCT                      40.9                12/22/2021           ?     MCV                      86.7                12/22/2021           ?     PLT                      250  12/22/2021           ?   ?Anesthesia Other Findings ? ? Reproductive/Obstetrics ?negative OB ROS ? ?  ? ? ? ? ? ? ? ? ? ? ? ? ? ?  ?  ? ? ? ? ? ? ?Anesthesia Physical ?Anesthesia Plan ? ?ASA: 3 ? ?Anesthesia Plan: General  ? ?Post-op Pain Management: Lidocaine infusion*, Toradol IV (intra-op)* and Precedex  ? ?Induction: Intravenous ? ?PONV Risk Score and Plan: 4 or greater and Treatment may vary due to age or medical condition, Midazolam, Ondansetron and  Dexamethasone ? ?Airway Management Planned: Oral ETT ? ?Additional Equipment: None ? ?Intra-op Plan:  ? ?Post-operative Plan: Extubation in OR ? ?Informed Consent: I have reviewed the patients History and Physical, chart, labs and discussed the procedure including the risks, benefits and alternatives for the proposed anesthesia with the patient or authorized representative who has indicated his/her understanding and acceptance.  ? ? ? ?Dental advisory given ? ?Plan Discussed with: CRNA and Anesthesiologist ? ?Anesthesia Plan Comments: Lissa Hoard note: Pt presented to PAT apt with HTN. Pt has had some high-normal BPs in physician's office prior, but not this high. Pt sent for medical clearance. PCP placed her on amlodipine, but was apparently reluctant to "clear" pt for surgery. Dr. Melba Coon called me to determine best course of action as patient has made all necessary arrangements to proceed with surgery tomorrow and rescheduling would be inconvenient for her. I reviewed her notes. In PAT her BP was 189/119. PCP reviewed was 184/110. Manual recheck was apparently ~150s/90s. Again, prior office visits 140s/90s is highest that have been measured prior. I discussed that her risks are most likely quite low as her BP has consistently been "high normal to mild HTN. I discussed with my anesthesia colleagues and we feel that if she presents for surgery with significantly elevated BP, tomorrow or any time, rescheduling would certainly be indicated, but if her BP were relatively normal, it should be reasonable to proceed with surgery. I discussed this with the patient and Dr. Melba Coon as well. )  ? ? ? ? ?Anesthesia Quick Evaluation ? ?

## 2021-12-25 ENCOUNTER — Observation Stay (HOSPITAL_BASED_OUTPATIENT_CLINIC_OR_DEPARTMENT_OTHER)
Admission: RE | Admit: 2021-12-25 | Discharge: 2021-12-26 | Disposition: A | Discharge status: Home / Self Care | Payer: No Typology Code available for payment source | Source: Ambulatory Visit | Attending: Obstetrics and Gynecology | Admitting: Obstetrics and Gynecology

## 2021-12-25 ENCOUNTER — Observation Stay (HOSPITAL_BASED_OUTPATIENT_CLINIC_OR_DEPARTMENT_OTHER): Payer: No Typology Code available for payment source | Admitting: Anesthesiology

## 2021-12-25 ENCOUNTER — Encounter (HOSPITAL_BASED_OUTPATIENT_CLINIC_OR_DEPARTMENT_OTHER)
Admission: RE | Disposition: A | Discharge status: Home / Self Care | Payer: Self-pay | Source: Ambulatory Visit | Attending: Obstetrics and Gynecology

## 2021-12-25 ENCOUNTER — Other Ambulatory Visit: Payer: Self-pay

## 2021-12-25 ENCOUNTER — Encounter (HOSPITAL_BASED_OUTPATIENT_CLINIC_OR_DEPARTMENT_OTHER): Payer: Self-pay | Admitting: Obstetrics and Gynecology

## 2021-12-25 DIAGNOSIS — D252 Subserosal leiomyoma of uterus: Secondary | ICD-10-CM | POA: Diagnosis not present

## 2021-12-25 DIAGNOSIS — N946 Dysmenorrhea, unspecified: Secondary | ICD-10-CM | POA: Diagnosis present

## 2021-12-25 DIAGNOSIS — I10 Essential (primary) hypertension: Secondary | ICD-10-CM

## 2021-12-25 DIAGNOSIS — Z90710 Acquired absence of both cervix and uterus: Secondary | ICD-10-CM | POA: Diagnosis present

## 2021-12-25 DIAGNOSIS — E039 Hypothyroidism, unspecified: Secondary | ICD-10-CM | POA: Diagnosis not present

## 2021-12-25 DIAGNOSIS — Z87891 Personal history of nicotine dependence: Secondary | ICD-10-CM | POA: Diagnosis not present

## 2021-12-25 DIAGNOSIS — Z79899 Other long term (current) drug therapy: Secondary | ICD-10-CM | POA: Insufficient documentation

## 2021-12-25 DIAGNOSIS — N920 Excessive and frequent menstruation with regular cycle: Secondary | ICD-10-CM

## 2021-12-25 HISTORY — PX: CYSTOSCOPY: SHX5120

## 2021-12-25 HISTORY — DX: Gastro-esophageal reflux disease without esophagitis: K21.9

## 2021-12-25 HISTORY — DX: Umbilical hernia without obstruction or gangrene: K42.9

## 2021-12-25 HISTORY — PX: LAPAROSCOPIC ASSISTED VAGINAL HYSTERECTOMY: SHX5398

## 2021-12-25 HISTORY — DX: Essential (primary) hypertension: I10

## 2021-12-25 LAB — CBC
HCT: 38.1 % (ref 36.0–46.0)
Hemoglobin: 12.4 g/dL (ref 12.0–15.0)
MCH: 28.2 pg (ref 26.0–34.0)
MCHC: 32.5 g/dL (ref 30.0–36.0)
MCV: 86.8 fL (ref 80.0–100.0)
Platelets: 266 10*3/uL (ref 150–400)
RBC: 4.39 MIL/uL (ref 3.87–5.11)
RDW: 13.2 % (ref 11.5–15.5)
WBC: 11.2 10*3/uL — ABNORMAL HIGH (ref 4.0–10.5)
nRBC: 0 % (ref 0.0–0.2)

## 2021-12-25 LAB — BASIC METABOLIC PANEL
Anion gap: 9 (ref 5–15)
BUN: 11 mg/dL (ref 6–20)
CO2: 22 mmol/L (ref 22–32)
Calcium: 8.4 mg/dL — ABNORMAL LOW (ref 8.9–10.3)
Chloride: 103 mmol/L (ref 98–111)
Creatinine, Ser: 0.84 mg/dL (ref 0.44–1.00)
GFR, Estimated: >60 mL/min (ref >60)
Glucose, Bld: 197 mg/dL — ABNORMAL HIGH (ref 70–99)
Potassium: 3.9 mmol/L (ref 3.5–5.1)
Sodium: 134 mmol/L — ABNORMAL LOW (ref 135–145)

## 2021-12-25 LAB — TYPE AND SCREEN
ABO/RH(D): A POS
Antibody Screen: NEGATIVE

## 2021-12-25 LAB — POCT PREGNANCY, URINE: Preg Test, Ur: NEGATIVE

## 2021-12-25 SURGERY — HYSTERECTOMY, VAGINAL, LAPAROSCOPY-ASSISTED
Anesthesia: General

## 2021-12-25 MED ORDER — OXYCODONE-ACETAMINOPHEN 5-325 MG PO TABS
1.0000 | ORAL_TABLET | ORAL | Status: DC | PRN
  Administered 2021-12-25 (×4): 2 via ORAL
  Administered 2021-12-26: 1 via ORAL

## 2021-12-25 MED ORDER — MIDAZOLAM HCL 2 MG/2ML IJ SOLN
INTRAMUSCULAR | Status: AC
  Filled 2021-12-25: qty 2

## 2021-12-25 MED ORDER — IBUPROFEN 800 MG PO TABS
ORAL_TABLET | ORAL | Status: AC
  Filled 2021-12-25: qty 1

## 2021-12-25 MED ORDER — OXYCODONE-ACETAMINOPHEN 5-325 MG PO TABS
ORAL_TABLET | ORAL | Status: AC
  Filled 2021-12-25: qty 2

## 2021-12-25 MED ORDER — LEVOTHYROXINE SODIUM 175 MCG PO TABS
175.0000 ug | ORAL_TABLET | Freq: Every day | ORAL | Status: DC
  Filled 2021-12-25: qty 1

## 2021-12-25 MED ORDER — CEFAZOLIN SODIUM-DEXTROSE 2-4 GM/100ML-% IV SOLN
INTRAVENOUS | Status: AC
  Filled 2021-12-25: qty 100

## 2021-12-25 MED ORDER — LIDOCAINE HCL (PF) 2 % IJ SOLN
INTRAMUSCULAR | Status: AC
  Filled 2021-12-25: qty 10

## 2021-12-25 MED ORDER — HYDROMORPHONE HCL 1 MG/ML IJ SOLN
INTRAMUSCULAR | Status: AC
  Filled 2021-12-25: qty 1

## 2021-12-25 MED ORDER — DEXMEDETOMIDINE (PRECEDEX) IN NS 20 MCG/5ML (4 MCG/ML) IV SYRINGE
PREFILLED_SYRINGE | INTRAVENOUS | Status: DC | PRN
  Administered 2021-12-25: 12 ug via INTRAVENOUS

## 2021-12-25 MED ORDER — LEVOTHYROXINE SODIUM 88 MCG PO TABS
176.0000 ug | ORAL_TABLET | Freq: Every day | ORAL | Status: DC
Start: 2021-12-25 — End: 2021-12-26
  Administered 2021-12-25: 176 ug via ORAL
  Filled 2021-12-25: qty 2

## 2021-12-25 MED ORDER — PROPOFOL 10 MG/ML IV BOLUS
INTRAVENOUS | Status: AC
  Filled 2021-12-25: qty 20

## 2021-12-25 MED ORDER — KETOROLAC TROMETHAMINE 30 MG/ML IJ SOLN: 30.0000 mg | Freq: Once | INTRAMUSCULAR | Status: DC | PRN

## 2021-12-25 MED ORDER — HYDROMORPHONE HCL 1 MG/ML IJ SOLN
0.2000 mg | INTRAMUSCULAR | Status: DC | PRN
  Administered 2021-12-25: 0.6 mg via INTRAVENOUS
  Administered 2021-12-25: 0.5 mg via INTRAVENOUS

## 2021-12-25 MED ORDER — DIPHENHYDRAMINE HCL 12.5 MG/5ML PO ELIX: 12.5000 mg | ORAL_SOLUTION | Freq: Four times a day (QID) | ORAL | Status: DC | PRN

## 2021-12-25 MED ORDER — LIDOCAINE HCL (PF) 2 % IJ SOLN
INTRAMUSCULAR | Status: AC
  Filled 2021-12-25: qty 5

## 2021-12-25 MED ORDER — ROCURONIUM BROMIDE 100 MG/10ML IV SOLN
INTRAVENOUS | Status: DC | PRN
  Administered 2021-12-25: 20 mg via INTRAVENOUS
  Administered 2021-12-25: 80 mg via INTRAVENOUS

## 2021-12-25 MED ORDER — OXYCODONE HCL 5 MG/5ML PO SOLN: 5.0000 mg | Freq: Once | ORAL | Status: DC | PRN

## 2021-12-25 MED ORDER — DEXAMETHASONE SODIUM PHOSPHATE 4 MG/ML IJ SOLN
INTRAMUSCULAR | Status: DC | PRN
  Administered 2021-12-25: 5 mg via INTRAVENOUS

## 2021-12-25 MED ORDER — ONDANSETRON HCL 4 MG/2ML IJ SOLN
INTRAMUSCULAR | Status: AC
  Filled 2021-12-25: qty 2

## 2021-12-25 MED ORDER — HYDROMORPHONE 1 MG/ML IV SOLN: INTRAVENOUS | Status: DC

## 2021-12-25 MED ORDER — BUPIVACAINE HCL (PF) 0.25 % IJ SOLN
INTRAMUSCULAR | Status: DC | PRN
  Administered 2021-12-25: 15 mL

## 2021-12-25 MED ORDER — AMLODIPINE BESYLATE 10 MG PO TABS
10.0000 mg | ORAL_TABLET | Freq: Every day | ORAL | Status: DC
  Administered 2021-12-26: 10 mg via ORAL
  Filled 2021-12-25 (×3): qty 1

## 2021-12-25 MED ORDER — ONDANSETRON HCL 4 MG/2ML IJ SOLN
4.0000 mg | Freq: Four times a day (QID) | INTRAMUSCULAR | Status: DC | PRN
  Administered 2021-12-25: 4 mg via INTRAVENOUS

## 2021-12-25 MED ORDER — FENTANYL CITRATE (PF) 250 MCG/5ML IJ SOLN
INTRAMUSCULAR | Status: AC
  Filled 2021-12-25: qty 5

## 2021-12-25 MED ORDER — CEFAZOLIN SODIUM 1 G IJ SOLR
INTRAMUSCULAR | Status: AC
  Filled 2021-12-25: qty 10

## 2021-12-25 MED ORDER — LACTATED RINGERS IV SOLN: INTRAVENOUS | Status: DC

## 2021-12-25 MED ORDER — VASOPRESSIN 20 UNIT/ML IV SOLN
INTRAVENOUS | Status: DC | PRN
  Administered 2021-12-25: 20 mL via INTRAMUSCULAR

## 2021-12-25 MED ORDER — MIDAZOLAM HCL 5 MG/5ML IJ SOLN
INTRAMUSCULAR | Status: DC | PRN
  Administered 2021-12-25: 2 mg via INTRAVENOUS

## 2021-12-25 MED ORDER — FLUORESCEIN SODIUM 10 % IV SOLN
INTRAVENOUS | Status: DC | PRN
Start: 2021-12-25 — End: 2021-12-25
  Administered 2021-12-25: 50 mg via INTRAVENOUS

## 2021-12-25 MED ORDER — MENTHOL 3 MG MT LOZG: 1.0000 | LOZENGE | OROMUCOSAL | Status: DC | PRN

## 2021-12-25 MED ORDER — SIMETHICONE 80 MG PO CHEW
CHEWABLE_TABLET | ORAL | Status: AC
  Filled 2021-12-25: qty 1

## 2021-12-25 MED ORDER — CEFAZOLIN IN SODIUM CHLORIDE 3-0.9 GM/100ML-% IV SOLN
3.0000 g | INTRAVENOUS | Status: AC
  Administered 2021-12-25: 3 g via INTRAVENOUS

## 2021-12-25 MED ORDER — LIDOCAINE HCL (CARDIAC) PF 100 MG/5ML IV SOSY
PREFILLED_SYRINGE | INTRAVENOUS | Status: DC | PRN
  Administered 2021-12-25: 100 mg via INTRAVENOUS

## 2021-12-25 MED ORDER — DEXMEDETOMIDINE (PRECEDEX) IN NS 20 MCG/5ML (4 MCG/ML) IV SYRINGE
PREFILLED_SYRINGE | INTRAVENOUS | Status: AC
  Filled 2021-12-25: qty 5

## 2021-12-25 MED ORDER — KETOROLAC TROMETHAMINE 30 MG/ML IJ SOLN
INTRAMUSCULAR | Status: DC | PRN
  Administered 2021-12-25: 30 mg via INTRAVENOUS

## 2021-12-25 MED ORDER — ONDANSETRON HCL 4 MG/2ML IJ SOLN: 4.0000 mg | Freq: Once | INTRAMUSCULAR | Status: DC | PRN

## 2021-12-25 MED ORDER — SODIUM CHLORIDE 0.9 % IR SOLN
Status: DC | PRN
  Administered 2021-12-25: 1000 mL via INTRAVESICAL
  Administered 2021-12-25: 1000 mL

## 2021-12-25 MED ORDER — GUAIFENESIN 100 MG/5ML PO LIQD
15.0000 mL | ORAL | Status: DC | PRN
  Filled 2021-12-25: qty 15

## 2021-12-25 MED ORDER — OXYCODONE HCL 5 MG PO TABS: 5.0000 mg | ORAL_TABLET | Freq: Once | ORAL | Status: DC | PRN

## 2021-12-25 MED ORDER — ROCURONIUM BROMIDE 10 MG/ML (PF) SYRINGE
PREFILLED_SYRINGE | INTRAVENOUS | Status: AC
  Filled 2021-12-25: qty 10

## 2021-12-25 MED ORDER — DEXAMETHASONE SODIUM PHOSPHATE 10 MG/ML IJ SOLN
INTRAMUSCULAR | Status: AC
  Filled 2021-12-25: qty 1

## 2021-12-25 MED ORDER — OXYCODONE-ACETAMINOPHEN 5-325 MG PO TABS: 1.0000 | ORAL_TABLET | Freq: Four times a day (QID) | ORAL | 0 refills | Status: AC | PRN

## 2021-12-25 MED ORDER — SODIUM CHLORIDE 0.9% FLUSH: 9.0000 mL | INTRAVENOUS | Status: DC | PRN

## 2021-12-25 MED ORDER — FENTANYL CITRATE (PF) 100 MCG/2ML IJ SOLN
INTRAMUSCULAR | Status: DC | PRN
  Administered 2021-12-25: 100 ug via INTRAVENOUS
  Administered 2021-12-25 (×2): 50 ug via INTRAVENOUS

## 2021-12-25 MED ORDER — SUGAMMADEX SODIUM 500 MG/5ML IV SOLN
INTRAVENOUS | Status: DC | PRN
  Administered 2021-12-25: 300 mg via INTRAVENOUS

## 2021-12-25 MED ORDER — FLUORESCEIN SODIUM 10 % IV SOLN
INTRAVENOUS | Status: AC
  Filled 2021-12-25: qty 5

## 2021-12-25 MED ORDER — KETOROLAC TROMETHAMINE 30 MG/ML IJ SOLN
INTRAMUSCULAR | Status: AC
  Filled 2021-12-25: qty 1

## 2021-12-25 MED ORDER — POVIDONE-IODINE 10 % EX SWAB: 2.0000 "application " | Freq: Once | CUTANEOUS | Status: DC

## 2021-12-25 MED ORDER — ONDANSETRON HCL 4 MG PO TABS: 4.0000 mg | ORAL_TABLET | Freq: Four times a day (QID) | ORAL | Status: DC | PRN

## 2021-12-25 MED ORDER — ALUM & MAG HYDROXIDE-SIMETH 200-200-20 MG/5ML PO SUSP: 30.0000 mL | ORAL | Status: DC | PRN

## 2021-12-25 MED ORDER — SIMETHICONE 80 MG PO CHEW
80.0000 mg | CHEWABLE_TABLET | Freq: Four times a day (QID) | ORAL | Status: DC | PRN
  Administered 2021-12-25: 80 mg via ORAL

## 2021-12-25 MED ORDER — ONDANSETRON HCL 4 MG/2ML IJ SOLN
INTRAMUSCULAR | Status: DC | PRN
  Administered 2021-12-25: 4 mg via INTRAVENOUS

## 2021-12-25 MED ORDER — PROPOFOL 10 MG/ML IV BOLUS
INTRAVENOUS | Status: DC | PRN
  Administered 2021-12-25: 150 mg via INTRAVENOUS

## 2021-12-25 MED ORDER — DIPHENHYDRAMINE HCL 50 MG/ML IJ SOLN: 12.5000 mg | Freq: Four times a day (QID) | INTRAMUSCULAR | Status: DC | PRN

## 2021-12-25 MED ORDER — ONDANSETRON HCL 4 MG/2ML IJ SOLN: 4.0000 mg | Freq: Four times a day (QID) | INTRAMUSCULAR | Status: DC | PRN

## 2021-12-25 MED ORDER — LIDOCAINE HCL (PF) 2 % IJ SOLN
INTRAMUSCULAR | Status: DC | PRN
  Administered 2021-12-25: 1.5 mg/kg/h via INTRADERMAL

## 2021-12-25 MED ORDER — HYDROMORPHONE HCL 1 MG/ML IJ SOLN
0.2500 mg | INTRAMUSCULAR | Status: DC | PRN
  Administered 2021-12-25 (×2): 0.5 mg via INTRAVENOUS

## 2021-12-25 MED ORDER — IBUPROFEN 800 MG PO TABS: 800.0000 mg | ORAL_TABLET | Freq: Three times a day (TID) | ORAL | 1 refills | Status: AC | PRN

## 2021-12-25 MED ORDER — NALOXONE HCL 0.4 MG/ML IJ SOLN: 0.4000 mg | INTRAMUSCULAR | Status: DC | PRN

## 2021-12-25 MED ORDER — IBUPROFEN 800 MG PO TABS
800.0000 mg | ORAL_TABLET | Freq: Three times a day (TID) | ORAL | Status: DC | PRN
  Administered 2021-12-25 – 2021-12-26 (×3): 800 mg via ORAL

## 2021-12-25 SURGICAL SUPPLY — 48 items
ADH SKN CLS APL DERMABOND .7 (GAUZE/BANDAGES/DRESSINGS) ×1
APL SRG 38 LTWT LNG FL B (MISCELLANEOUS)
APPLICATOR ARISTA FLEXITIP XL (MISCELLANEOUS) IMPLANT
BAG DECANTER FOR FLEXI CONT (MISCELLANEOUS) ×1 IMPLANT
CABLE HIGH FREQUENCY MONO STRZ (ELECTRODE) IMPLANT
CNTNR URN SCR LID CUP LEK RST (MISCELLANEOUS) ×1 IMPLANT
CONT SPEC 4OZ STRL OR WHT (MISCELLANEOUS) ×2
COVER BACK TABLE 60X90IN (DRAPES) ×2 IMPLANT
COVER MAYO STAND STRL (DRAPES) ×4 IMPLANT
DECANTER SPIKE VIAL GLASS SM (MISCELLANEOUS) IMPLANT
DERMABOND ADVANCED (GAUZE/BANDAGES/DRESSINGS) ×1
DERMABOND ADVANCED .7 DNX12 (GAUZE/BANDAGES/DRESSINGS) ×1 IMPLANT
DRSG COVADERM PLUS 2X2 (GAUZE/BANDAGES/DRESSINGS) IMPLANT
DRSG OPSITE POSTOP 3X4 (GAUZE/BANDAGES/DRESSINGS) IMPLANT
DURAPREP 26ML APPLICATOR (WOUND CARE) ×2 IMPLANT
ELECT REM PT RETURN 9FT ADLT (ELECTROSURGICAL) ×2
ELECTRODE REM PT RTRN 9FT ADLT (ELECTROSURGICAL) ×1 IMPLANT
GAUZE 4X4 16PLY ~~LOC~~+RFID DBL (SPONGE) ×5 IMPLANT
GLOVE SURG ENC MOIS LTX SZ6.5 (GLOVE) ×9 IMPLANT
HEMOSTAT ARISTA ABSORB 3G PWDR (HEMOSTASIS) IMPLANT
HIBICLENS CHG 4% 4OZ BTL (MISCELLANEOUS) ×2 IMPLANT
KIT TURNOVER CYSTO (KITS) ×2 IMPLANT
NEEDLE INSUFFLATION 120MM (ENDOMECHANICALS) ×2 IMPLANT
NS IRRIG 1000ML POUR BTL (IV SOLUTION) ×2 IMPLANT
PACK LAVH (CUSTOM PROCEDURE TRAY) ×2 IMPLANT
PACK ROBOTIC GOWN (GOWN DISPOSABLE) ×2 IMPLANT
PACK TRENDGUARD 450 HYBRID PRO (MISCELLANEOUS) ×1 IMPLANT
PAD OB MATERNITY 4.3X12.25 (PERSONAL CARE ITEMS) ×2 IMPLANT
PROTECTOR NERVE ULNAR (MISCELLANEOUS) ×4 IMPLANT
SET IRRIG Y TYPE TUR BLADDER L (SET/KITS/TRAYS/PACK) ×2 IMPLANT
SET SUCTION IRRIG HYDROSURG (IRRIGATION / IRRIGATOR) ×2 IMPLANT
SET TUBE SMOKE EVAC HIGH FLOW (TUBING) ×2 IMPLANT
SHEARS HARMONIC ACE PLUS 36CM (ENDOMECHANICALS) ×2 IMPLANT
SPONGE T-LAP 4X18 ~~LOC~~+RFID (SPONGE) ×2 IMPLANT
SUT VIC AB 1 CT1 18XBRD ANBCTR (SUTURE) ×2 IMPLANT
SUT VIC AB 1 CT1 8-18 (SUTURE) ×4
SUT VIC AB 2-0 CT1 (SUTURE) ×2 IMPLANT
SUT VIC AB 3-0 SH 27 (SUTURE)
SUT VIC AB 3-0 SH 27X BRD (SUTURE) IMPLANT
SUT VIC AB 4-0 PS2 27 (SUTURE) ×2 IMPLANT
SUT VICRYL 0 TIES 12 18 (SUTURE) ×2 IMPLANT
SUT VICRYL 0 UR6 27IN ABS (SUTURE) IMPLANT
TOWEL OR 17X26 10 PK STRL BLUE (TOWEL DISPOSABLE) ×2 IMPLANT
TRAY FOLEY W/BAG SLVR 14FR LF (SET/KITS/TRAYS/PACK) ×2 IMPLANT
TRENDGUARD 450 HYBRID PRO PACK (MISCELLANEOUS) ×2
TROCAR BLADELESS OPT 5 100 (ENDOMECHANICALS) ×6 IMPLANT
TROCAR BLADELESS OPT 5 150 (ENDOMECHANICALS) ×1 IMPLANT
WARMER LAPAROSCOPE (MISCELLANEOUS) ×2 IMPLANT

## 2021-12-25 NOTE — Interval H&P Note (Signed)
History and Physical Interval Note: ? ?12/25/2021 ?7:09 AM ? ?Jill Murillo  has presented today for surgery, with the diagnosis of dysmenorrhea.  The various methods of treatment have been discussed with the patient and family. After consideration of risks, benefits and other options for treatment, the patient has consented to  Procedure(s): ?LAPAROSCOPIC ASSISTED VAGINAL HYSTERECTOMY (N/A) ?CYSTOSCOPY (N/A) as a surgical intervention.  The patient's history has been reviewed, patient examined, no change in status, stable for surgery.  I have reviewed the patient's chart and labs.  Questions were answered to the patient's satisfaction.   ? ? ?Tavian Callander Bovard-Stuckert ? ? ?

## 2021-12-25 NOTE — Progress Notes (Addendum)
Day of Surgery Procedure(s) (LRB): ?LAPAROSCOPIC ASSISTED VAGINAL HYSTERECTOMY (N/A) ?CYSTOSCOPY (N/A) ? ?Subjective: ?Patient reports incisional pain, tolerating PO, and no problems voiding.  Labs stable.   ? ?Objective: ?I have reviewed patient's vital signs, intake and output, medications, and labs. ? ?General: alert and no distress ?Resp: clear to auscultation bilaterally ?Cardio: regular rate and rhythm ?GI: soft, non-tender; bowel sounds normal; no masses,  no organomegaly and incision: clean, dry, and intact ?Extremities: extremities normal, atraumatic, no cyanosis or edema ? ?Assessment: ?s/p Procedure(s): ?LAPAROSCOPIC ASSISTED VAGINAL HYSTERECTOMY (N/A) ?CYSTOSCOPY (N/A): stable and progressing well ? ?Plan: ?Continue regular diet ?Encourage ambulation ?Continue po meds ?Pt wants to wait for d/c in AM - pain not as well controlled as she was hoping ?BP stable ?Plan for d/c in AM ? ? LOS: 0 days  ? ? ?Tilia Faso Bovard-Stuckert ?12/25/2021, 5:10 PM ? ? ? ? ?

## 2021-12-25 NOTE — Op Note (Signed)
NAME: Jill Murillo, Jill Murillo ?MEDICAL RECORD NO: [id number] ?ACCOUNT NO: [id number] ?DATE OF BIRTH: 1981/01/06 ?FACILITY: [facility] ?LOCATION: [facility] ?PHYSICIAN: Janyth Contes, MD ? ?Operative Report  ? ?DATE OF PROCEDURE: 12/25/2021 ? ?PREOPERATIVE DIAGNOSIS:  Dysmenorrhea, menorrhagia.  ? ?POSTOPERATIVE DIAGNOSIS:  Dysmenorrhea, menorrhagia.  ? ?PROCEDURE:  Laparoscopic-assisted vaginal hysterectomy and cystoscopy. ? ?SURGEON:  Janyth Contes, MD ? ?ASSISTANT:  Carlynn Purl, DO ? ?ANESTHESIA:  Local and general. ? ?ESTIMATED BLOOD LOSS:  50 mL ? ?URINE OUTPUT:  Clear at the end of the procedure. ? ?INTRAVENOUS FLUIDS:  Per anesthesia. ? ?COMPLICATIONS:  None. ? ?PATHOLOGY:  Uterus, cervix and segments of bilateral fallopian tubes. ? ?DESCRIPTION OF PROCEDURE:  After informed consent was reviewed with the patient including risks, benefits and alternatives of the surgical procedure, she was transported to the operating room and placed on the table in supine position.  General  ?anesthesia was then induced and found to be adequate.  After an appropriate timeout was performed, she was prepped and draped in the normal sterile fashion.  She was placed in the [place].  A Foley catheter was sterilely placed.  A Hulka  ?manipulator was placed in her uterus.  Gloves and gown were changed.  Attention was turned to the abdominal portion of the case.  An approximately 5 mm infraumbilical incision was made.  The trocar was placed under direct visualization.  Accessory ports  ?were placed on both the right and left with direct visualization.  Brief pelvic survey performed revealed normal uterus with fallopian tube segments, status post salpingectomy as well as bilateral normal ovaries.  Appendix was not visualized.  The tubal  ?segment was grasped at the distal end and was separated from the tissue to the level of the cornu.  The round ligaments were ligated bilaterally and pedicles were made to the level of the  uterine artery.  The bladder flap was created sharply.  The  ?uterine artery was then ligated.  Attention was turned to the left side, which in a similar fashion the tubal segment was grasped and it was separated from the underlying tissue.  The round ligament was ligated bilaterally.  Pedicles were continued to  ?the level of the uterine artery.  Bladder flap was created sharply and met up with the bladder flap from the right side.  The uterine artery was ligated.  Attention was turned to the vaginal portion of the case.  The cervix was injected with 20 mL of  ?vasopressin and circumscribed with Bovie cautery.  The anterior cul-de-sac was entered sharply.  The posterior cul-de-sac was entered sharply.  Uterosacral ligaments were ligated bilaterally and held in a stepwise fashion to meet up with the work that  ?had been done from above.  The pedicles were created.  The uterus was delivered.  The pedicles were all noted to be hemostatic.  The uterosacral ligaments were plicated. The held sutures were tied together.  The vaginal cuff was closed with 2-0 Vicryl.   ?Cystoscopy was performed revealing bilateral ureteral jets.  Attention was turned to the abdominal portion of the case.  Gloves and gown were changed.  The pedicles were noted to be hemostatic.  The trocars were removed under direct visualization.  The  ?trocar sites were closed with 4-0 Vicryl and Dermabond.  The patient tolerated the procedure well.  Sponge, lap and needle counts were correct x2 per the operating staff. ? ? ? ?PAA ?D: 12/25/2021 9:37:13 am T: 12/25/2021 10:03:00 am  ?JOB: [id number]/ [id number]  ?

## 2021-12-25 NOTE — Anesthesia Procedure Notes (Signed)
Procedure Name: Intubation ?Date/Time: 12/25/2021 7:28 AM ?Performed by: Bufford Spikes, CRNA ?Pre-anesthesia Checklist: Patient identified, Emergency Drugs available, Suction available and Patient being monitored ?Patient Re-evaluated:Patient Re-evaluated prior to induction ?Oxygen Delivery Method: Circle system utilized ?Preoxygenation: Pre-oxygenation with 100% oxygen ?Induction Type: IV induction ?Ventilation: Mask ventilation without difficulty ?Laryngoscope Size: Sabra Heck and 2 ?Tube type: Oral ?Tube size: 7.0 mm ?Number of attempts: 1 ?Airway Equipment and Method: Stylet and Oral airway ?Placement Confirmation: ETT inserted through vocal cords under direct vision, positive ETCO2 and breath sounds checked- equal and bilateral ?Secured at: 21 cm ?Tube secured with: Tape ?Dental Injury: Teeth and Oropharynx as per pre-operative assessment  ? ? ? ? ?

## 2021-12-25 NOTE — Brief Op Note (Signed)
12/25/2021 ? ?9:29 AM ? ?PATIENT:  Addilee Raigoza  41 y.o. female ? ?PRE-OPERATIVE DIAGNOSIS:  dysmenorrhea, menorrhagia ? ?POST-OPERATIVE DIAGNOSIS:  dysmenorrhea, menorrhagia ? ?PROCEDURE:  Procedure(s): ?LAPAROSCOPIC ASSISTED VAGINAL HYSTERECTOMY (N/A) ?CYSTOSCOPY (N/A) ? ?SURGEON:  Surgeon(s) and Role: ?   * Bovard-Stuckert, Jeral Fruit, MD - Primary ?   * Banga, Bonnee Quin, DO - Assisting ? ?ANESTHESIA:   local and general ? ?EBL:  50 mL uop clear at end of procedure, IVF per anesthesia ? ?DRAINS: Urinary Catheter (Foley)  ? ?LOCAL MEDICATIONS USED:  MARCAINE    ? ?SPECIMEN:  Source of Specimen:  uterus, cervix, B fallopian tube segments ? ?DISPOSITION OF SPECIMEN:  PATHOLOGY ? ?COUNTS:  YES ? ?TOURNIQUET:  * No tourniquets in log * ? ?DICTATION: .Other Dictation: Dictation Number [id number] ? ?PLAN OF CARE: Admit for overnight observation ? ?PATIENT DISPOSITION:  PACU - hemodynamically stable. ?  ?Delay start of Pharmacological VTE agent (>24hrs) due to surgical blood loss or risk of bleeding: not applicable ? ?

## 2021-12-25 NOTE — Transfer of Care (Signed)
Immediate Anesthesia Transfer of Care Note ? ?Patient: Jill Murillo ? ?Procedure(s) Performed: LAPAROSCOPIC ASSISTED VAGINAL HYSTERECTOMY ?CYSTOSCOPY ? ?Patient Location: PACU ? ?Anesthesia Type:General ? ?Level of Consciousness: awake, alert  and oriented ? ?Airway & Oxygen Therapy: Patient Spontanous Breathing and Patient connected to nasal cannula oxygen ? ?Post-op Assessment: Report given to RN and Post -op Vital signs reviewed and stable ? ?Post vital signs: Reviewed and stable ? ?Last Vitals:  ?Vitals Value Taken Time  ?BP 109/66 12/25/21 0930  ?Temp 37.4 ?C 12/25/21 0927  ?Pulse 80 12/25/21 0934  ?Resp 24 12/25/21 0934  ?SpO2 94 % 12/25/21 0934  ?Vitals shown include unvalidated device data. ? ?Last Pain:  ?Vitals:  ? 12/25/21 0550  ?TempSrc: Oral  ?   ? ?  ? ?Complications: No notable events documented. ?

## 2021-12-25 NOTE — Anesthesia Postprocedure Evaluation (Signed)
Anesthesia Post Note ? ?Patient: Jill Murillo ? ?Procedure(s) Performed: LAPAROSCOPIC ASSISTED VAGINAL HYSTERECTOMY ?CYSTOSCOPY ? ?  ? ?Patient location during evaluation: PACU ?Anesthesia Type: General ?Level of consciousness: awake and alert ?Pain management: pain level controlled ?Vital Signs Assessment: post-procedure vital signs reviewed and stable ?Respiratory status: spontaneous breathing, nonlabored ventilation, respiratory function stable and patient connected to nasal cannula oxygen ?Cardiovascular status: blood pressure returned to baseline and stable ?Postop Assessment: no apparent nausea or vomiting ?Anesthetic complications: no ? ? ?No notable events documented. ? ?Last Vitals:  ?Vitals:  ? 12/25/21 1015 12/25/21 1040  ?BP: 103/60 104/67  ?Pulse: 71 70  ?Resp: 20 20  ?Temp: 36.6 ?C 36.6 ?C  ?SpO2:  98%  ?  ?Last Pain:  ?Vitals:  ? 12/25/21 1040  ?TempSrc: Oral  ?PainSc: 6   ? ? ?  ?  ?  ?  ?  ?  ? ?Barnet Glasgow ? ? ? ? ?

## 2021-12-26 ENCOUNTER — Encounter (HOSPITAL_BASED_OUTPATIENT_CLINIC_OR_DEPARTMENT_OTHER): Payer: Self-pay | Admitting: Obstetrics and Gynecology

## 2021-12-26 DIAGNOSIS — D252 Subserosal leiomyoma of uterus: Secondary | ICD-10-CM | POA: Diagnosis not present

## 2021-12-26 LAB — SURGICAL PATHOLOGY

## 2021-12-26 MED ORDER — OXYCODONE-ACETAMINOPHEN 5-325 MG PO TABS
ORAL_TABLET | ORAL | Status: AC
  Filled 2021-12-26: qty 1

## 2021-12-26 MED ORDER — IBUPROFEN 800 MG PO TABS
ORAL_TABLET | ORAL | Status: AC
  Filled 2021-12-26: qty 1

## 2021-12-26 NOTE — Discharge Summary (Signed)
Physician Discharge Summary  ?Patient ID: ?Jill Murillo ?MRN: [id number] ?DOB/AGE: 1981-05-20 41 y.o. ? ?Admit date: 12/25/2021 ?Discharge date: 12/26/2021 ? ?Admission Diagnoses: ?Menorrhagia, dysmenorrhea ?Discharge Diagnoses:  ?Principal Problem: ?  S/P laparoscopic assisted vaginal hysterectomy (LAVH) ?Menorrhagia, dysmenorrhea ? ?Discharged Condition: good ? ?Hospital Course: admitted for [facility] 3/16 - underwent surgery without complication.  D/C to home POD#1 ambulating, voiding, tolerating po and pain controlled.  Labs stable.   ? ?Consults: None ? ?Significant Diagnostic Studies: labs: CBC, BMP ?LAVH/cysto ?Treatments: IV hydration, analgesia: Dilaudid and percocet, motrin, and surgery: LAVH/cysto ? ?Discharge Exam: ?Blood pressure 136/84, pulse 74, temperature 98.2 ?F (36.8 ?C), temperature source Oral, resp. rate 16, height 5' 4.25" (1.632 m), weight 116.6 kg, last menstrual period 11/27/2021, SpO2 100 %, unknown if currently breastfeeding. ?General appearance: alert, cooperative, and no distress ?Resp: clear to auscultation bilaterally ?Cardio: regular rate and rhythm ?GI: soft, non-tender; bowel sounds normal; no masses,  no organomegaly and Inc C/D/I ?Extremities: extremities normal, atraumatic, no cyanosis or edema ? ?Disposition: Discharge disposition: 01-Home or Self Care ? ? ? ? ? ? ?Discharge Instructions   ? ? Call MD for:  persistant nausea and vomiting   Complete by: As directed ?  ? Call MD for:  redness, tenderness, or signs of infection (pain, swelling, redness, odor or green/yellow discharge around incision site)   Complete by: As directed ?  ? Call MD for:  severe uncontrolled pain   Complete by: As directed ?  ? Diet - low sodium heart healthy   Complete by: As directed ?  ? Discharge instructions   Complete by: As directed ?  ? Call [phone] with questions or problems (or call Dr Melba Coon [phone])  ? Driving Restrictions   Complete by: As directed ?  ? While taking strong pain  medicines  ? Increase activity slowly   Complete by: As directed ?  ? Lifting restrictions   Complete by: As directed ?  ? No greater than 10-15lbs for 6 weeks  ? No wound care   Complete by: As directed ?  ? Glue (dermabond) will peel at your incisions, you may see suture material at skin  ? Sexual Activity Restrictions   Complete by: As directed ?  ? Pelvic rest - no douching, tampons or sex for 6 weeks  ? ?  ? ?Allergies as of 12/26/2021   ?No Known Allergies ?  ? ?  ?Medication List  ?  ? ?STOP taking these medications   ? ?acetaminophen 325 MG tablet ?Commonly known as: Tylenol ?  ? ?  ? ?TAKE these medications   ? ?amLODipine 10 MG tablet ?Commonly known as: NORVASC ?Take 10 mg by mouth daily. ?  ?ibuprofen 800 MG tablet ?Commonly known as: ADVIL ?Take 1 tablet (800 mg total) by mouth every 8 (eight) hours as needed for moderate pain (mild pain). ?What changed:  ?medication strength ?how much to take ?when to take this ?reasons to take this ?  ?levothyroxine 175 MCG tablet ?Commonly known as: SYNTHROID ?Take 175 mcg by mouth at bedtime. ?  ?oxyCODONE-acetaminophen 5-325 MG tablet ?Commonly known as: PERCOCET/ROXICET ?Take 1-2 tablets by mouth every 6 (six) hours as needed for severe pain. ?  ? ?  ? ? Follow-up Information   ? ? Bovard-Stuckert, Lilli Dewald, MD. Schedule an appointment as soon as possible for a visit in 2 day(s).   ?Specialty: Obstetrics and Gynecology ?Why: 2 and 6 weeks for postop check ?Contact information: ?[address] ?  [place] [address] ?[phone] ? ? ?  ?  ? ?  ?  ? ?  ? ? ?Signed: ?Christinea Brizuela Bovard-Stuckert ?12/26/2021, 7:23 AM ? ? ?

## 2021-12-26 NOTE — Progress Notes (Signed)
1 Day Post-Op Procedure(s) (LRB): ?LAPAROSCOPIC ASSISTED VAGINAL HYSTERECTOMY (N/A) ?CYSTOSCOPY (N/A) ? ?Subjective: ?Patient reports incisional pain, tolerating PO, + flatus, and no problems voiding.  Ambulating without problems.  BP well controlled ? ?Objective: ?I have reviewed patient's vital signs, intake and output, medications, and labs. ? ?General: alert and no distress ?Resp: clear to auscultation bilaterally ?Cardio: regular rate and rhythm ?GI: soft, non-tender; bowel sounds normal; no masses,  no organomegaly and incision: clean, dry, and intact ?Extremities: extremities normal, atraumatic, no cyanosis or edema ? ?Assessment: ?s/p Procedure(s): ?LAPAROSCOPIC ASSISTED VAGINAL HYSTERECTOMY (N/A) ?CYSTOSCOPY (N/A): stable and progressing well ? ?Plan: ?Encourage ambulation ?Discharge home with percocet and Motrin. ?F/u 2 and 6 weeks ? LOS: 0 days  ? ? ?Jill Murillo ?12/26/2021, 7:17 AM ? ? ? ? ?

## 2022-04-10 IMAGING — MG MM BREAST BX W/ LOC DEV 1ST LESION IMAGE BX SPEC STEREO GUIDE*R*
8 of 10 series · 8 of 22 positions shown · non-contrast
Comparison: Previous exams.
COMPARISON: Previous exams.

Addendum:
CLINICAL DATA: 39-year-old female presenting for stereotactic
biopsy of a right breast asymmetry.

EXAM:
RIGHT BREAST STEREOTACTIC CORE NEEDLE BIOPSY

[R (1 of 6)]
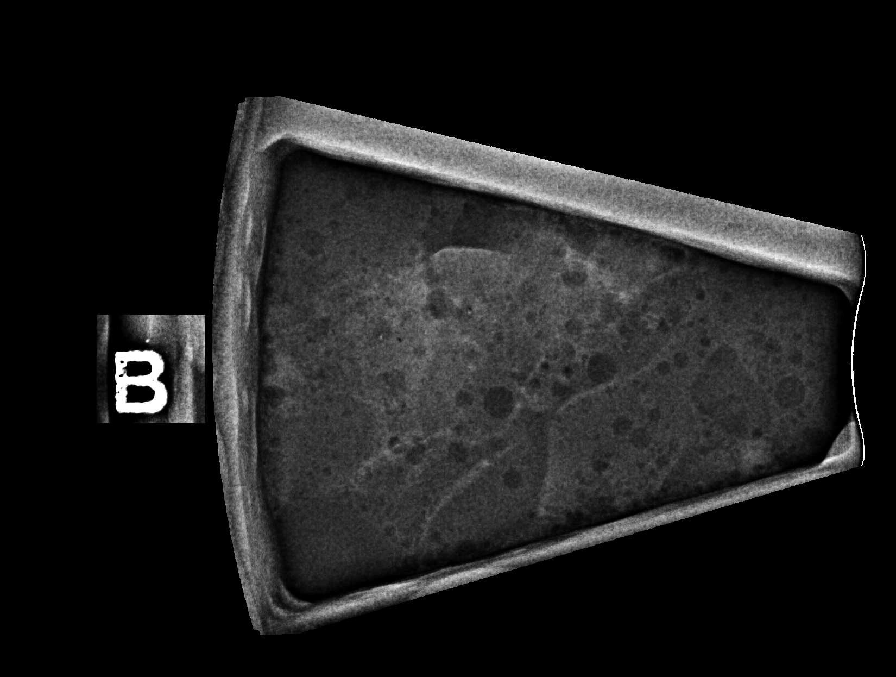

[R (2 of 6)]
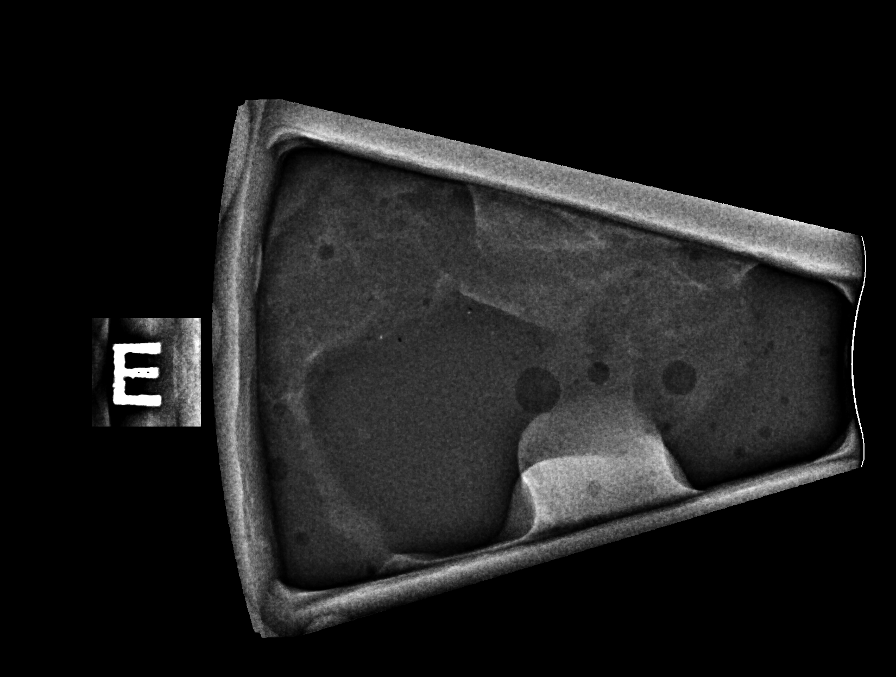

[R (3 of 6)]
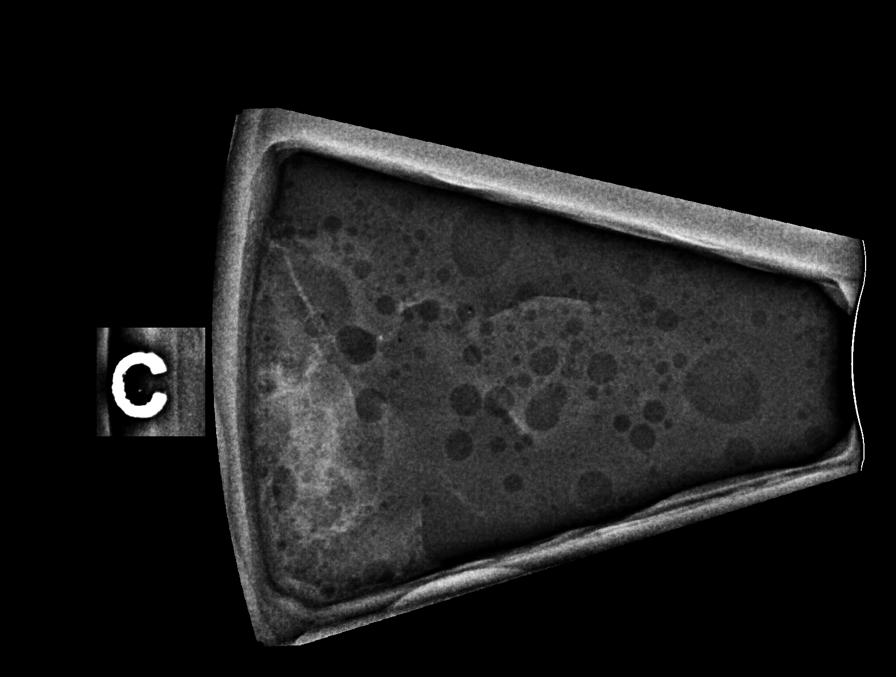

[R (4 of 6)]
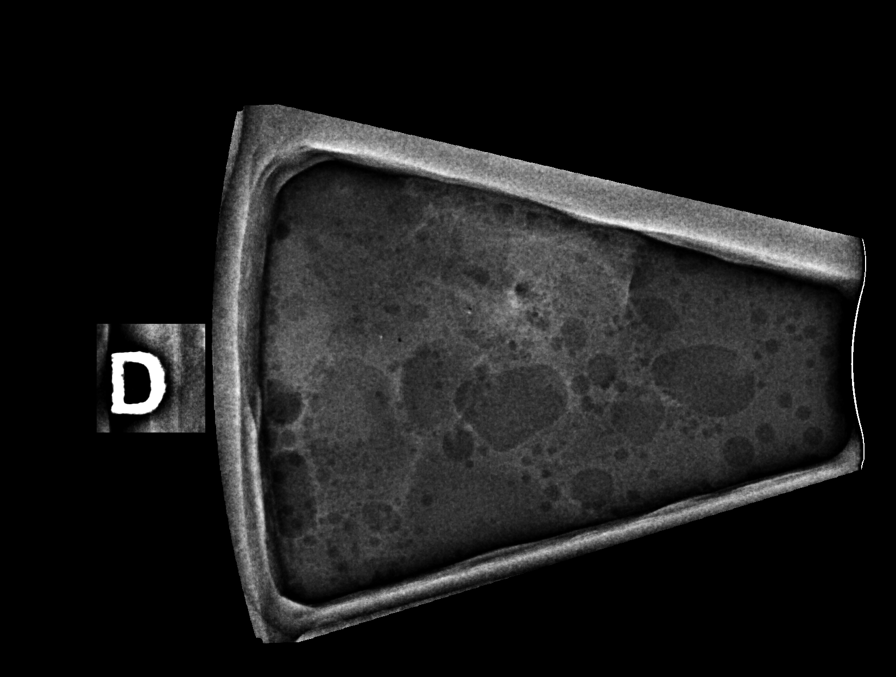

[R (5 of 6)]
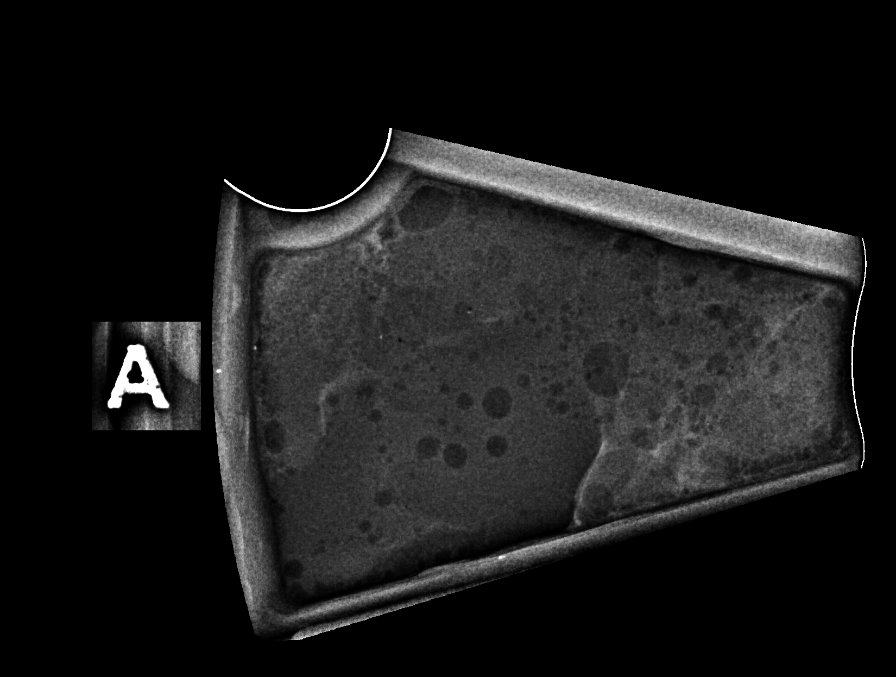

[R (6 of 6)]
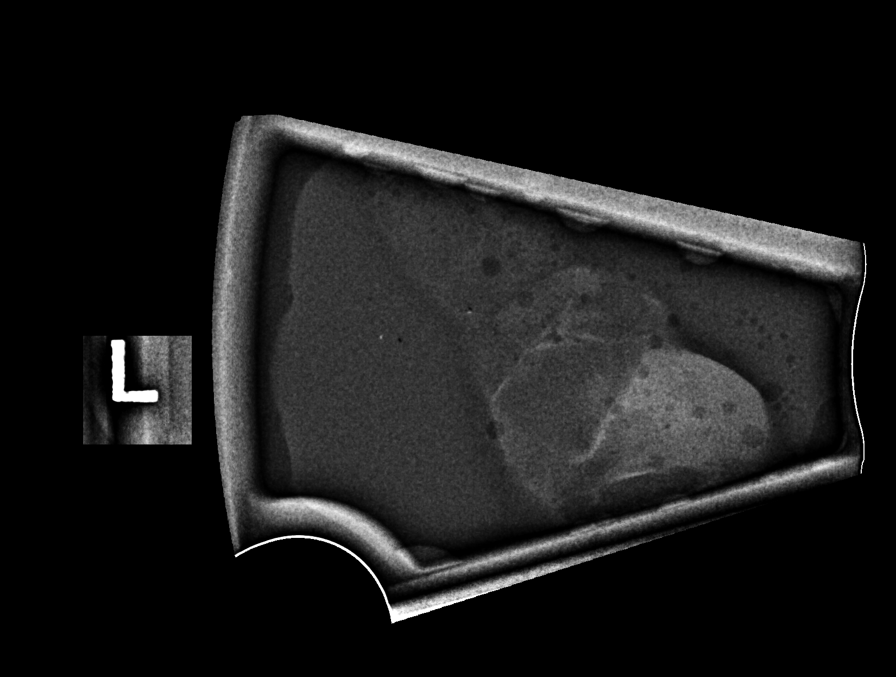

[R ML]
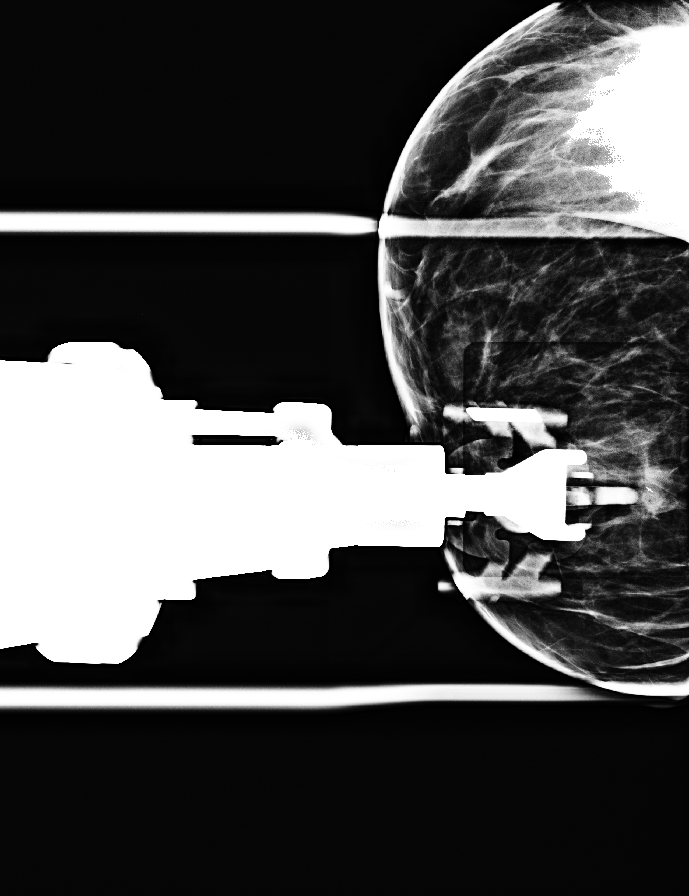

[R ML tomo · tomo slice 29/58.0]
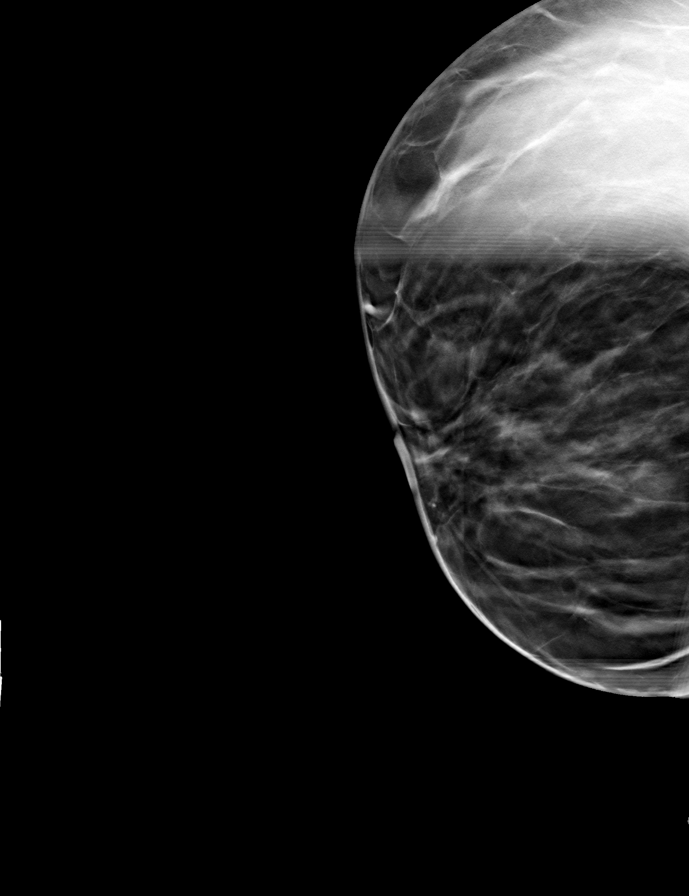

[8 of 22 positions shown; findings below may reference images not displayed]



Using sterile technique and 1% Lidocaine as local anesthetic, under
stereotactic guidance, a 9 gauge vacuum assisted device was used to
perform core needle biopsy of an asymmetry in the lower-inner right
breast using a medial approach.

Lesion quadrant: Lower inner quadrant

At the conclusion of the procedure, an X shaped tissue marker clip
was deployed into the biopsy cavity. Follow-up 2-view mammogram was
performed and dictated separately.
IMPRESSION: Stereotactic-guided biopsy of an asymmetry in the lower-inner right
breast. No apparent complications.

ADDENDUM:
Pathology revealed FIBROADENOMA of the Right breast, lower inner (x
clip). This was found to be concordant by Dr. Badini Blagna.

Pathology results were discussed with the patient by telephone. The
patient reported doing well after the biopsy with tenderness at the
site. Post biopsy instructions and care were reviewed and questions
were answered. The patient was encouraged to call The [REDACTED]

The patient was instructed to return for annual screening
mammography.

Pathology results reported by Yurizel Norato, RN on 04/04/2021.



Using sterile technique and 1% Lidocaine as local anesthetic, under
stereotactic guidance, a 9 gauge vacuum assisted device was used to
perform core needle biopsy of an asymmetry in the lower-inner right
breast using a medial approach.

Lesion quadrant: Lower inner quadrant

At the conclusion of the procedure, an X shaped tissue marker clip
was deployed into the biopsy cavity. Follow-up 2-view mammogram was
performed and dictated separately.
IMPRESSION: Stereotactic-guided biopsy of an asymmetry in the lower-inner right
breast. No apparent complications.
